# Patient Record
Sex: Female | Born: 1945
Health system: Southern US, Community
[De-identification: ages and names within clinical notes are randomized; demographics above are authoritative.]

## PROBLEM LIST (undated history)

## (undated) DIAGNOSIS — H04129 Dry eye syndrome of unspecified lacrimal gland: Secondary | ICD-10-CM

## (undated) DIAGNOSIS — G250 Essential tremor: Secondary | ICD-10-CM

## (undated) DIAGNOSIS — M1990 Unspecified osteoarthritis, unspecified site: Secondary | ICD-10-CM

## (undated) DIAGNOSIS — L309 Dermatitis, unspecified: Secondary | ICD-10-CM

## (undated) DIAGNOSIS — R112 Nausea with vomiting, unspecified: Secondary | ICD-10-CM

## (undated) DIAGNOSIS — G4700 Insomnia, unspecified: Secondary | ICD-10-CM

## (undated) DIAGNOSIS — Z9889 Other specified postprocedural states: Secondary | ICD-10-CM

## (undated) DIAGNOSIS — I498 Other specified cardiac arrhythmias: Secondary | ICD-10-CM

## (undated) DIAGNOSIS — K219 Gastro-esophageal reflux disease without esophagitis: Secondary | ICD-10-CM

## (undated) DIAGNOSIS — R42 Dizziness and giddiness: Secondary | ICD-10-CM

## (undated) DIAGNOSIS — I10 Essential (primary) hypertension: Secondary | ICD-10-CM

## (undated) DIAGNOSIS — M722 Plantar fascial fibromatosis: Secondary | ICD-10-CM

## (undated) HISTORY — PX: ABDOMINAL HYSTERECTOMY: SHX81

## (undated) HISTORY — PX: CERVICAL FUSION: SHX112

## (undated) HISTORY — DX: Gastro-esophageal reflux disease without esophagitis: K21.9

## (undated) HISTORY — PX: CHOLECYSTECTOMY: SHX55

## (undated) HISTORY — DX: Unspecified osteoarthritis, unspecified site: M19.90

## (undated) HISTORY — PX: BREAST SURGERY: SHX581

## (undated) HISTORY — PX: SHOULDER ARTHROSCOPY: SHX128

## (undated) HISTORY — PX: APPENDECTOMY: SHX54

## (undated) HISTORY — PX: KNEE ARTHROSCOPY WITH DRILLING/MICROFRACTURE: SHX6425

## (undated) HISTORY — DX: Essential tremor: G25.0

## (undated) HISTORY — PX: JOINT REPLACEMENT: SHX530

## (undated) HISTORY — PX: CATARACT EXTRACTION, BILATERAL: SHX1313

## (undated) HISTORY — PX: SPINE SURGERY: SHX786

## (undated) HISTORY — PX: BICEPS TENDON REPAIR: SHX566

---

## 1973-06-08 HISTORY — PX: BREAST BIOPSY: SHX20

## 2011-10-05 LAB — HM COLONOSCOPY

## 2014-03-16 LAB — HM MAMMOGRAPHY

## 2014-04-17 ENCOUNTER — Ambulatory Visit: Payer: Self-pay | Admitting: Family Medicine

## 2014-04-27 ENCOUNTER — Telehealth: Payer: Self-pay | Admitting: General Practice

## 2014-04-27 NOTE — Telephone Encounter (Signed)
Left a vm to r/s patient to another provider who can accept a new medicare patient. Maybe Baity ?

## 2014-05-01 ENCOUNTER — Encounter: Payer: Self-pay | Admitting: Family Medicine

## 2014-05-10 ENCOUNTER — Telehealth: Payer: Self-pay | Admitting: General Practice

## 2014-05-10 NOTE — Telephone Encounter (Signed)
Per [facility] patient cancelled appt. Called pt to verify cancellation of appt.

## 2014-05-11 ENCOUNTER — Ambulatory Visit (INDEPENDENT_AMBULATORY_CARE_PROVIDER_SITE_OTHER): Payer: Self-pay | Admitting: Family Medicine

## 2014-05-11 ENCOUNTER — Ambulatory Visit: Payer: Self-pay | Admitting: Family Medicine

## 2014-05-11 ENCOUNTER — Encounter: Payer: Self-pay | Admitting: Family Medicine

## 2014-05-11 ENCOUNTER — Encounter (INDEPENDENT_AMBULATORY_CARE_PROVIDER_SITE_OTHER): Payer: Self-pay

## 2014-05-11 VITALS — BP 126/76 | HR 63 | Temp 97.9°F | Ht 62.25 in | Wt 168.8 lb

## 2014-05-11 DIAGNOSIS — G250 Essential tremor: Secondary | ICD-10-CM

## 2014-05-11 DIAGNOSIS — M1990 Unspecified osteoarthritis, unspecified site: Secondary | ICD-10-CM

## 2014-05-11 DIAGNOSIS — R251 Tremor, unspecified: Secondary | ICD-10-CM

## 2014-05-11 DIAGNOSIS — K219 Gastro-esophageal reflux disease without esophagitis: Secondary | ICD-10-CM

## 2014-05-11 NOTE — Progress Notes (Signed)
Pre visit review using our clinic review tool, if applicable. No additional management support is needed unless otherwise documented below in the visit note.  New patient.    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  Mult family members with similar.  On BB with some relief.  She didn't tolerate higher dose of BB prev.  No ADE on current dose/med. No other new focal neuro sx.    OA, has seen ortho and note reviewed. Taking NSAID occ, with GERD hx noted, on PPI and doing well.  No acute complaints.   PMH and SH reviewed  ROS: See HPI, otherwise noncontributory.  Meds, vitals, and allergies reviewed.   GEN: nad, alert and oriented HEENT: mucous membranes moist NECK: supple w/o LA CV: rrr.  PULM: ctab, no inc wob ABD: soft, +bs EXT: no edema SKIN: no acute rash CN 2-12 wnl B, S/S wnl x4 Hand>head tremor noted.

## 2014-05-13 LAB — BASIC METABOLIC PANEL
BUN: 17 mg/dL (ref 6–23)
CHLORIDE: 105 meq/L (ref 96–112)
CO2: 29 meq/L (ref 19–32)
Calcium: 9.2 mg/dL (ref 8.4–10.5)
Creatinine, Ser: 0.8 mg/dL (ref 0.4–1.2)
GFR: 79.9 mL/min (ref >60.00)
GLUCOSE: 81 mg/dL (ref 70–99)
Potassium: 4.4 mEq/L (ref 3.5–5.1)
SODIUM: 141 meq/L (ref 135–145)

## 2014-05-14 ENCOUNTER — Encounter: Payer: Self-pay | Admitting: Family Medicine

## 2014-05-14 DIAGNOSIS — G250 Essential tremor: Secondary | ICD-10-CM | POA: Insufficient documentation

## 2014-05-14 DIAGNOSIS — M1990 Unspecified osteoarthritis, unspecified site: Secondary | ICD-10-CM | POA: Insufficient documentation

## 2014-05-14 DIAGNOSIS — K219 Gastro-esophageal reflux disease without esophagitis: Secondary | ICD-10-CM | POA: Insufficient documentation

## 2014-05-14 NOTE — Assessment & Plan Note (Signed)
Controlled, not bothersome now.  Continue BB as is and she'll update me as needed. We can refer to neuro if needed.  Overall unremarkable exam given her history.  She agrees. >30 minutes spent in face to face time with patient, >50% spent in counselling or coordination of care

## 2014-05-14 NOTE — Assessment & Plan Note (Signed)
Would continue PPI with ongoing NSAID use.  Doing well.  Healthy habits encouraged.

## 2014-05-15 ENCOUNTER — Encounter: Payer: Self-pay | Admitting: Family Medicine

## 2014-05-15 LAB — CHOLESTEROL, TOTAL
ALT: 12 U/L (ref 7–35)
AST: 17 U/L
CHOLESTEROL, TOTAL: 211
CREATININE: 0.68
Glucose: 88
HDL: 75 mg/dL — AB (ref 35–70)
LDL CALC: 121
TRIGLYCERIDES: 74

## 2014-05-17 ENCOUNTER — Encounter: Payer: Self-pay | Admitting: *Deleted

## 2014-05-22 ENCOUNTER — Encounter: Payer: Self-pay | Admitting: Family Medicine

## 2014-09-19 ENCOUNTER — Telehealth: Payer: Self-pay | Admitting: Family Medicine

## 2014-09-19 NOTE — Telephone Encounter (Signed)
Form is on your desk.

## 2014-09-19 NOTE — Telephone Encounter (Signed)
Pt dropped off medical clearance form to be filled out and faxed to [facility]. Please call when completed. Thank you

## 2014-09-20 NOTE — Telephone Encounter (Signed)
  Form was faxed by Morey Hummingbird to [facility].

## 2014-09-20 NOTE — Telephone Encounter (Signed)
Form signed, thanks.

## 2015-04-18 LAB — HM MAMMOGRAPHY: HM MAMMO: NORMAL

## 2015-04-22 ENCOUNTER — Encounter: Payer: Self-pay | Admitting: Family Medicine

## 2015-04-26 ENCOUNTER — Other Ambulatory Visit: Payer: Self-pay | Admitting: Family Medicine

## 2015-04-26 NOTE — Telephone Encounter (Signed)
Electronically refill request  Prescriptions have not been prescribed by Dr Damita Dunnings yet. Ok to refill? Last seen on 05/11/2014. CPE schedule on 06/13/2015.

## 2015-04-28 NOTE — Telephone Encounter (Signed)
Sent. Thanks.   

## 2015-05-13 ENCOUNTER — Other Ambulatory Visit: Payer: Self-pay | Admitting: Family Medicine

## 2015-05-13 DIAGNOSIS — M949 Disorder of cartilage, unspecified: Secondary | ICD-10-CM

## 2015-05-13 DIAGNOSIS — E785 Hyperlipidemia, unspecified: Secondary | ICD-10-CM

## 2015-05-13 DIAGNOSIS — M899 Disorder of bone, unspecified: Secondary | ICD-10-CM

## 2015-05-15 ENCOUNTER — Other Ambulatory Visit (INDEPENDENT_AMBULATORY_CARE_PROVIDER_SITE_OTHER): Payer: Medicare Other

## 2015-05-15 DIAGNOSIS — M949 Disorder of cartilage, unspecified: Secondary | ICD-10-CM | POA: Diagnosis not present

## 2015-05-15 DIAGNOSIS — M899 Disorder of bone, unspecified: Secondary | ICD-10-CM

## 2015-05-15 DIAGNOSIS — E785 Hyperlipidemia, unspecified: Secondary | ICD-10-CM | POA: Diagnosis not present

## 2015-05-15 LAB — COMPREHENSIVE METABOLIC PANEL
ALT: 13 U/L (ref 0–35)
AST: 18 U/L (ref 0–37)
Albumin: 3.8 g/dL (ref 3.5–5.2)
Alkaline Phosphatase: 57 U/L (ref 39–117)
BUN: 15 mg/dL (ref 6–23)
CHLORIDE: 105 meq/L (ref 96–112)
CO2: 29 mEq/L (ref 19–32)
CREATININE: 0.79 mg/dL (ref 0.40–1.20)
Calcium: 9.2 mg/dL (ref 8.4–10.5)
GFR: 76.19 mL/min (ref >60.00)
Glucose, Bld: 90 mg/dL (ref 70–99)
Potassium: 4.2 mEq/L (ref 3.5–5.1)
SODIUM: 142 meq/L (ref 135–145)
TOTAL PROTEIN: 6.4 g/dL (ref 6.0–8.3)
Total Bilirubin: 0.4 mg/dL (ref 0.2–1.2)

## 2015-05-15 LAB — LIPID PANEL
CHOL/HDL RATIO: 4
CHOLESTEROL: 172 mg/dL (ref 0–200)
HDL: 48.4 mg/dL (ref >39.00)
LDL CALC: 102 mg/dL — AB (ref 0–99)
NonHDL: 123.4
Triglycerides: 105 mg/dL (ref 0.0–149.0)
VLDL: 21 mg/dL (ref 0.0–40.0)

## 2015-05-15 LAB — VITAMIN D 25 HYDROXY (VIT D DEFICIENCY, FRACTURES): VITD: 41.97 ng/mL (ref 30.00–100.00)

## 2015-05-22 ENCOUNTER — Encounter: Payer: Self-pay | Admitting: Family Medicine

## 2015-05-22 ENCOUNTER — Ambulatory Visit (INDEPENDENT_AMBULATORY_CARE_PROVIDER_SITE_OTHER): Payer: Medicare Other | Admitting: Family Medicine

## 2015-05-22 VITALS — BP 104/68 | HR 60 | Temp 98.5°F | Ht 62.0 in | Wt 180.5 lb

## 2015-05-22 DIAGNOSIS — E2839 Other primary ovarian failure: Secondary | ICD-10-CM

## 2015-05-22 DIAGNOSIS — Z23 Encounter for immunization: Secondary | ICD-10-CM

## 2015-05-22 DIAGNOSIS — Z0000 Encounter for general adult medical examination without abnormal findings: Secondary | ICD-10-CM

## 2015-05-22 DIAGNOSIS — Z119 Encounter for screening for infectious and parasitic diseases, unspecified: Secondary | ICD-10-CM

## 2015-05-22 DIAGNOSIS — M1990 Unspecified osteoarthritis, unspecified site: Secondary | ICD-10-CM | POA: Diagnosis not present

## 2015-05-22 DIAGNOSIS — G250 Essential tremor: Secondary | ICD-10-CM | POA: Diagnosis not present

## 2015-05-22 DIAGNOSIS — Z7189 Other specified counseling: Secondary | ICD-10-CM

## 2015-05-22 DIAGNOSIS — K219 Gastro-esophageal reflux disease without esophagitis: Secondary | ICD-10-CM

## 2015-05-22 MED ORDER — PROPRANOLOL HCL 60 MG PO TABS: 60.0000 mg | ORAL_TABLET | Freq: Two times a day (BID) | ORAL | Status: DC

## 2015-05-22 MED ORDER — OMEPRAZOLE 20 MG PO CPDR: DELAYED_RELEASE_CAPSULE | ORAL | Status: DC

## 2015-05-22 MED ORDER — MELOXICAM 15 MG PO TABS: 15.0000 mg | ORAL_TABLET | Freq: Every day | ORAL | Status: DC | PRN

## 2015-05-22 NOTE — Patient Instructions (Signed)
Katie Hale will call about your referral. Take care.  Glad to see you.  Don't change your meds.  Update me as needed.

## 2015-05-22 NOTE — Progress Notes (Signed)
Pre visit review using our clinic review tool, if applicable. No additional management support is needed unless otherwise documented below in the visit note.  I have personally reviewed the Medicare Annual Wellness questionnaire and have noted 1. The patient's medical and social history 2. Their use of alcohol, tobacco or illicit drugs 3. Their current medications and supplements 4. The patient's functional ability including ADL's, fall risks, home safety risks and hearing or visual             impairment. 5. Diet and physical activities 6. Evidence for depression or mood disorders  The patients weight, height, BMI have been recorded in the chart and visual acuity is per eye clinic.  I have made referrals, counseling and provided education to the patient based review of the above and I have provided the pt with a written personalized care plan for preventive services.  Provider list updated- see scanned forms.  Routine anticipatory guidance given to patient.  See health maintenance.  Flu 2016 Shingles prev done. PNA 2016 Tetanus ~9 years ago per patient report.  Colonoscopy 2013 Breast cancer screening 2016 DXA pending.  Advance directive- husband designated if patient were incapacitated.  Cognitive function addressed- see scanned forms- and if abnormal then additional documentation follows.  Pt opts in for HCV screening with next set of labs.  D/w pt re: routine screening.    Tremor.  Noted FH.  Not worse overall recently.  Some improvement with BB, worse with caffeine.  Not bothersome enough to change mgmt per patient report.  D/w pt about referral if needed.  She wants to hold off for now.  No ADE on current med.   GERD controlled with PPI, no ADE.  Doing well.  Occ nsaid use for OA but not ADE with med.   PMH and SH reviewed  Meds, vitals, and allergies reviewed.   ROS: See HPI.  Otherwise negative.    GEN: nad, alert and oriented HEENT: mucous membranes moist NECK: supple  w/o LA CV: rrr. PULM: ctab, no inc wob ABD: soft, +bs EXT: no edema SKIN: no acute rash B hand and jaw tremor noted at baseline.

## 2015-05-23 DIAGNOSIS — Z7189 Other specified counseling: Secondary | ICD-10-CM | POA: Insufficient documentation

## 2015-05-23 DIAGNOSIS — Z0000 Encounter for general adult medical examination without abnormal findings: Secondary | ICD-10-CM | POA: Insufficient documentation

## 2015-05-23 NOTE — Assessment & Plan Note (Signed)
Continue prn nsaid with food.

## 2015-05-23 NOTE — Assessment & Plan Note (Signed)
Flu 2016 Shingles prev done. PNA 2016 Tetanus ~9 years ago per patient report.  Colonoscopy 2013 Breast cancer screening 2016 DXA pending.  Advance directive- husband designated if patient were incapacitated.   Cognitive function addressed- see scanned forms- and if abnormal then additional documentation follows.  Pt opts in for HCV screening with next set of labs.  D/w pt re: routine screening.

## 2015-05-23 NOTE — Assessment & Plan Note (Signed)
Continue PPI ?

## 2015-05-23 NOTE — Assessment & Plan Note (Signed)
Noted FH. Not worse overall recently. Some improvement with BB, worse with caffeine. Not bothersome enough to change mgmt per patient report. D/w pt about referral if needed. She wants to hold off for now. No ADE on current med.

## 2015-06-07 ENCOUNTER — Other Ambulatory Visit: Payer: Self-pay

## 2015-06-13 ENCOUNTER — Ambulatory Visit
Admission: RE | Admit: 2015-06-13 | Discharge: 2015-06-13 | Disposition: A | Discharge status: Home / Self Care | Payer: Medicare Other | Source: Ambulatory Visit | Attending: Family Medicine | Admitting: Family Medicine

## 2015-06-13 ENCOUNTER — Encounter: Payer: Self-pay | Admitting: Family Medicine

## 2015-06-13 DIAGNOSIS — Z780 Asymptomatic menopausal state: Secondary | ICD-10-CM | POA: Diagnosis not present

## 2015-06-13 DIAGNOSIS — E2839 Other primary ovarian failure: Secondary | ICD-10-CM | POA: Diagnosis present

## 2015-07-27 ENCOUNTER — Other Ambulatory Visit: Payer: Self-pay | Admitting: Family Medicine

## 2015-11-05 ENCOUNTER — Telehealth: Payer: Self-pay

## 2015-11-05 NOTE — Telephone Encounter (Signed)
Patient is on the list for [employer] 2017 and may be a good candidate for an AWV in 2017.  

## 2015-11-12 NOTE — Telephone Encounter (Signed)
Pt is not due for an AWV until December 2017.

## 2015-11-29 NOTE — Telephone Encounter (Signed)
pls let me know when/if it is scheduled.  

## 2016-04-21 ENCOUNTER — Other Ambulatory Visit: Payer: Self-pay | Admitting: Family Medicine

## 2016-04-21 DIAGNOSIS — Z1231 Encounter for screening mammogram for malignant neoplasm of breast: Secondary | ICD-10-CM

## 2016-04-24 ENCOUNTER — Ambulatory Visit: Admission: RE | Admit: 2016-04-24 | Payer: Medicare Other | Source: Ambulatory Visit

## 2016-05-05 ENCOUNTER — Ambulatory Visit
Admission: RE | Admit: 2016-05-05 | Discharge: 2016-05-05 | Disposition: A | Discharge status: Home / Self Care | Payer: Medicare Other | Source: Ambulatory Visit | Attending: Family Medicine | Admitting: Family Medicine

## 2016-05-05 DIAGNOSIS — Z1231 Encounter for screening mammogram for malignant neoplasm of breast: Secondary | ICD-10-CM | POA: Diagnosis present

## 2016-05-10 ENCOUNTER — Other Ambulatory Visit: Payer: Self-pay | Admitting: Family Medicine

## 2016-05-10 DIAGNOSIS — E785 Hyperlipidemia, unspecified: Secondary | ICD-10-CM

## 2016-05-13 ENCOUNTER — Other Ambulatory Visit: Payer: Self-pay | Admitting: *Deleted

## 2016-05-13 ENCOUNTER — Inpatient Hospital Stay
Admission: RE | Admit: 2016-05-13 | Discharge: 2016-05-13 | Disposition: A | Discharge status: Home / Self Care | Payer: Self-pay | Source: Ambulatory Visit | Attending: *Deleted | Admitting: *Deleted

## 2016-05-13 DIAGNOSIS — Z9289 Personal history of other medical treatment: Secondary | ICD-10-CM

## 2016-05-15 ENCOUNTER — Encounter: Payer: Self-pay | Admitting: *Deleted

## 2016-05-15 ENCOUNTER — Other Ambulatory Visit (INDEPENDENT_AMBULATORY_CARE_PROVIDER_SITE_OTHER): Payer: Medicare Other

## 2016-05-15 DIAGNOSIS — Z119 Encounter for screening for infectious and parasitic diseases, unspecified: Secondary | ICD-10-CM

## 2016-05-15 DIAGNOSIS — E785 Hyperlipidemia, unspecified: Secondary | ICD-10-CM | POA: Diagnosis not present

## 2016-05-15 LAB — COMPREHENSIVE METABOLIC PANEL
ALBUMIN: 4.2 g/dL (ref 3.5–5.2)
ALK PHOS: 65 U/L (ref 39–117)
ALT: 13 U/L (ref 0–35)
AST: 16 U/L (ref 0–37)
BILIRUBIN TOTAL: 0.4 mg/dL (ref 0.2–1.2)
BUN: 17 mg/dL (ref 6–23)
CO2: 31 mEq/L (ref 19–32)
CREATININE: 0.8 mg/dL (ref 0.40–1.20)
Calcium: 9.5 mg/dL (ref 8.4–10.5)
Chloride: 105 mEq/L (ref 96–112)
GFR: 74.88 mL/min (ref >60.00)
GLUCOSE: 97 mg/dL (ref 70–99)
Potassium: 4 mEq/L (ref 3.5–5.1)
SODIUM: 142 meq/L (ref 135–145)
TOTAL PROTEIN: 7.4 g/dL (ref 6.0–8.3)

## 2016-05-15 LAB — LIPID PANEL
CHOLESTEROL: 205 mg/dL — AB (ref 0–200)
HDL: 68.5 mg/dL (ref >39.00)
LDL Cholesterol: 120 mg/dL — ABNORMAL HIGH (ref 0–99)
NONHDL: 136.7
Total CHOL/HDL Ratio: 3
Triglycerides: 85 mg/dL (ref 0.0–149.0)
VLDL: 17 mg/dL (ref 0.0–40.0)

## 2016-05-16 LAB — HEPATITIS C ANTIBODY: HCV AB: NEGATIVE

## 2016-05-22 ENCOUNTER — Ambulatory Visit (INDEPENDENT_AMBULATORY_CARE_PROVIDER_SITE_OTHER): Payer: Medicare Other | Admitting: Family Medicine

## 2016-05-22 ENCOUNTER — Encounter: Payer: Self-pay | Admitting: Family Medicine

## 2016-05-22 VITALS — BP 132/80 | HR 53 | Temp 98.6°F | Ht 62.0 in | Wt 180.0 lb

## 2016-05-22 DIAGNOSIS — M1990 Unspecified osteoarthritis, unspecified site: Secondary | ICD-10-CM | POA: Diagnosis not present

## 2016-05-22 DIAGNOSIS — G250 Essential tremor: Secondary | ICD-10-CM

## 2016-05-22 DIAGNOSIS — Z0000 Encounter for general adult medical examination without abnormal findings: Secondary | ICD-10-CM

## 2016-05-22 DIAGNOSIS — Z7189 Other specified counseling: Secondary | ICD-10-CM

## 2016-05-22 MED ORDER — MELOXICAM 15 MG PO TABS: 15.0000 mg | ORAL_TABLET | Freq: Every day | ORAL | 3 refills | Status: DC | PRN

## 2016-05-22 MED ORDER — ONDANSETRON HCL 4 MG PO TABS: 4.0000 mg | ORAL_TABLET | Freq: Three times a day (TID) | ORAL | 2 refills | Status: DC | PRN

## 2016-05-22 MED ORDER — PROPRANOLOL HCL 60 MG PO TABS: 60.0000 mg | ORAL_TABLET | Freq: Two times a day (BID) | ORAL | 3 refills | Status: DC

## 2016-05-22 MED ORDER — OMEPRAZOLE 20 MG PO CPDR: DELAYED_RELEASE_CAPSULE | ORAL | 3 refills | Status: DC

## 2016-05-22 NOTE — Progress Notes (Signed)
I have personally reviewed the Medicare Annual Wellness questionnaire and have noted 1. The patient's medical and social history 2. Their use of alcohol, tobacco or illicit drugs 3. Their current medications and supplements 4. The patient's functional ability including ADL's, fall risks, home safety risks and hearing or visual             impairment. 5. Diet and physical activities 6. Evidence for depression or mood disorders  The patients weight, height, BMI have been recorded in the chart and visual acuity is per eye clinic.  I have made referrals, counseling and provided education to the patient based review of the above and I have provided the pt with a written personalized care plan for preventive services.  Provider list updated- see scanned forms.  Routine anticipatory guidance given to patient.  See health maintenance.  Flu 2017 Shingles prev done. PNA 2017 Tetanus 2008 Colonoscopy 2013 Breast cancer screening 2017 DXA 2017. Advance directive- daughter designated if patient were incapacitated.   Cognitive function addressed- see scanned forms- and if abnormal then additional documentation follows.  Labs d/w pt.   Tremor.  No ADE on med.  Manageable.  Complaint with beta blocker.   H/o OA.  She has taken meloxicam prn in the meantime.  No ADE on med.  Hips prev injected.    Her husband is on hospice and she is working through all of the changes.   This is a noted stressor for her.  She is trying to help him have as good a day as possible.  She has nausea that is likely stress related.  No vomiting, no passed blood.    PMH and SH reviewed  Meds, vitals, and allergies reviewed.   ROS: Per HPI.  Unless specifically indicated otherwise in HPI, the patient denies:  General: fever. Eyes: acute vision changes ENT: sore throat Cardiovascular: chest pain Respiratory: SOB GI: vomiting GU: dysuria Musculoskeletal: acute back pain Derm: acute rash Neuro: acute motor  dysfunction Psych: worsening mood Endocrine: polydipsia Heme: bleeding Allergy: hayfever  GEN: nad, alert and oriented HEENT: mucous membranes moist NECK: supple w/o LA CV: rrr. PULM: ctab, no inc wob ABD: soft, +bs EXT: no edema SKIN: no acute rash Tremor noted at baseline

## 2016-05-22 NOTE — Patient Instructions (Addendum)
Let me know when you need/want to get a colonoscopy set up or if you want to see neurology about the tremor.  Take care.  Glad to see you.  Update me as needed.

## 2016-05-22 NOTE — Progress Notes (Signed)
Pre visit review using our clinic review tool, if applicable. No additional management support is needed unless otherwise documented below in the visit note. 

## 2016-05-24 NOTE — Assessment & Plan Note (Signed)
If worsening she will let me know and we can refer to neurology. Continue as is for now. She agrees.

## 2016-05-24 NOTE — Assessment & Plan Note (Addendum)
Flu 2017 Shingles prev done. PNA 2017 Tetanus 2008 Colonoscopy 2013- see after visit summary. She will update me when she wants to go for another colonoscopy. Breast cancer screening 2017 DXA 2017. Advance directive- daughter designated if patient were incapacitated.   Cognitive function addressed- see scanned forms- and if abnormal then additional documentation follows.  Labs d/w pt.

## 2016-05-24 NOTE — Assessment & Plan Note (Signed)
Advance directive- daughter designated if patient were incapacitated.   

## 2016-05-24 NOTE — Assessment & Plan Note (Signed)
No adverse effect on medication. Continue as is. Update me as needed. She agrees.

## 2016-07-16 ENCOUNTER — Encounter: Payer: Self-pay | Admitting: Family Medicine

## 2016-07-17 ENCOUNTER — Other Ambulatory Visit: Payer: Self-pay | Admitting: Family Medicine

## 2016-07-17 MED ORDER — PREDNISONE 10 MG PO TABS: ORAL_TABLET | ORAL | 0 refills | Status: DC

## 2017-05-11 ENCOUNTER — Other Ambulatory Visit: Payer: Self-pay | Admitting: Family Medicine

## 2017-05-11 DIAGNOSIS — E785 Hyperlipidemia, unspecified: Secondary | ICD-10-CM

## 2017-05-12 ENCOUNTER — Other Ambulatory Visit (INDEPENDENT_AMBULATORY_CARE_PROVIDER_SITE_OTHER): Payer: Medicare Other

## 2017-05-12 ENCOUNTER — Ambulatory Visit: Payer: Medicare Other

## 2017-05-12 ENCOUNTER — Other Ambulatory Visit: Payer: Self-pay | Admitting: Family Medicine

## 2017-05-12 DIAGNOSIS — E785 Hyperlipidemia, unspecified: Secondary | ICD-10-CM | POA: Diagnosis not present

## 2017-05-12 DIAGNOSIS — Z1231 Encounter for screening mammogram for malignant neoplasm of breast: Secondary | ICD-10-CM

## 2017-05-12 LAB — COMPREHENSIVE METABOLIC PANEL
ALBUMIN: 4.2 g/dL (ref 3.5–5.2)
ALT: 16 U/L (ref 0–35)
AST: 19 U/L (ref 0–37)
Alkaline Phosphatase: 62 U/L (ref 39–117)
BILIRUBIN TOTAL: 0.7 mg/dL (ref 0.2–1.2)
BUN: 14 mg/dL (ref 6–23)
CALCIUM: 9 mg/dL (ref 8.4–10.5)
CHLORIDE: 103 meq/L (ref 96–112)
CO2: 29 mEq/L (ref 19–32)
CREATININE: 0.73 mg/dL (ref 0.40–1.20)
GFR: 83 mL/min (ref >60.00)
Glucose, Bld: 84 mg/dL (ref 70–99)
Potassium: 3.8 mEq/L (ref 3.5–5.1)
Sodium: 140 mEq/L (ref 135–145)
Total Protein: 7.1 g/dL (ref 6.0–8.3)

## 2017-05-12 LAB — LIPID PANEL
CHOLESTEROL: 178 mg/dL (ref 0–200)
HDL: 64 mg/dL (ref >39.00)
LDL Cholesterol: 93 mg/dL (ref 0–99)
NONHDL: 113.86
Total CHOL/HDL Ratio: 3
Triglycerides: 105 mg/dL (ref 0.0–149.0)
VLDL: 21 mg/dL (ref 0.0–40.0)

## 2017-05-17 ENCOUNTER — Encounter: Payer: Medicare Other | Admitting: Family Medicine

## 2017-05-25 ENCOUNTER — Other Ambulatory Visit: Payer: Self-pay | Admitting: Family Medicine

## 2017-05-25 ENCOUNTER — Ambulatory Visit: Payer: Medicare Other

## 2017-05-25 NOTE — Telephone Encounter (Signed)
Electronic refill request. Meloxicam Last office visit:   05/22/16  Patient has CPE scheduled 06/07/17 Last Filled:    30 tablet 3 05/22/2016  Please advise.

## 2017-05-26 NOTE — Telephone Encounter (Signed)
Sent. Thanks.   

## 2017-06-07 ENCOUNTER — Encounter: Payer: Self-pay | Admitting: Family Medicine

## 2017-06-07 ENCOUNTER — Ambulatory Visit (INDEPENDENT_AMBULATORY_CARE_PROVIDER_SITE_OTHER): Payer: Medicare Other | Admitting: Family Medicine

## 2017-06-07 VITALS — BP 132/68 | HR 58 | Temp 97.7°F | Wt 181.5 lb

## 2017-06-07 DIAGNOSIS — Z7189 Other specified counseling: Secondary | ICD-10-CM

## 2017-06-07 DIAGNOSIS — Z0000 Encounter for general adult medical examination without abnormal findings: Secondary | ICD-10-CM | POA: Diagnosis not present

## 2017-06-07 DIAGNOSIS — G250 Essential tremor: Secondary | ICD-10-CM | POA: Diagnosis not present

## 2017-06-07 MED ORDER — OMEPRAZOLE 20 MG PO CPDR: DELAYED_RELEASE_CAPSULE | ORAL | 3 refills | Status: DC

## 2017-06-07 MED ORDER — PROPRANOLOL HCL 60 MG PO TABS: 60.0000 mg | ORAL_TABLET | Freq: Two times a day (BID) | ORAL | 3 refills | Status: DC

## 2017-06-07 MED ORDER — ONDANSETRON HCL 4 MG PO TABS: 4.0000 mg | ORAL_TABLET | Freq: Three times a day (TID) | ORAL | 2 refills | Status: DC | PRN

## 2017-06-07 NOTE — Patient Instructions (Signed)
Thanks for your effort.  Take care.  Glad to see you.  Update me as needed.  Colonoscopy when possible- let me know.  Thanks for talking to me to day.

## 2017-06-07 NOTE — Progress Notes (Signed)
I have personally reviewed the Medicare Annual Wellness questionnaire and have noted 1. The patient's medical and social history 2. Their use of alcohol, tobacco or illicit drugs 3. Their current medications and supplements 4. The patient's functional ability including ADL's, fall risks, home safety risks and hearing or visual             impairment. 5. Diet and physical activities 6. Evidence for depression or mood disorders  The patients weight, height, BMI have been recorded in the chart and visual acuity is per eye clinic.  I have made referrals, counseling and provided education to the patient based review of the above and I have provided the pt with a written personalized care plan for preventive services.  Provider list updated- see scanned forms.  Routine anticipatory guidance given to patient.  See health maintenance. The possibility exists that previously documented standard health maintenance information may have been brought forward from a previous encounter into this note.  If needed, that same information has been updated to reflect the current situation based on today's encounter.    Flu 2018 Shingles prev done. PNA 2017 Tetanus 2008 Colonoscopy 2013- see after visit summary. She will update me when she wants to go for another colonoscopy. Defer for now given the situation with her husband, d/w pt.   Breast cancer screening 2017 DXA 2017. Advance directive- daughter designated if patient were incapacitated.  Cognitive function addressed- see scanned forms- and if abnormal then additional documentation follows.  Labs d/w pt.   Tremor d/w pt.  Still on BB.  She is tolerating her current situation.  D/w pt about possible neuro referral, but defer for now.   She agrees.  FH tremor noted.   Still on prn meloxicam.  She has R hip bursitis pain, prev injected by ortho.  That is the most painful spot for patient.  She is still icing.  She is tolerating the situation as is.   She  is caring for her husband who is dying on hospice with cancer.    PMH and SH reviewed  Meds, vitals, and allergies reviewed.   ROS: Per HPI.  Unless specifically indicated otherwise in HPI, the patient denies:  General: fever. Eyes: acute vision changes ENT: sore throat Cardiovascular: chest pain Respiratory: SOB GI: vomiting GU: dysuria Musculoskeletal: acute back pain Derm: acute rash Neuro: acute motor dysfunction Psych: worsening mood Endocrine: polydipsia Heme: bleeding Allergy: hayfever  GEN: nad, alert and oriented HEENT: mucous membranes moist NECK: supple w/o LA CV: rrr. PULM: ctab, no inc wob ABD: soft, +bs EXT: no edema SKIN: no acute rash Tremor noted in hands and jaw at baseline.

## 2017-06-09 NOTE — Assessment & Plan Note (Signed)
Flu 2018 Shingles prev done. PNA 2017 Tetanus 2008 Colonoscopy 2013- see after visit summary. She will update me when she wants to go for another colonoscopy. Defer for now given the situation with her husband, d/w pt.   Breast cancer screening 2017 DXA 2017. Advance directive- daughter designated if patient were incapacitated.  Cognitive function addressed- see scanned forms- and if abnormal then additional documentation follows.  Labs d/w pt.

## 2017-06-09 NOTE — Assessment & Plan Note (Addendum)
We can refer to neurology later on.  Patient will update me as needed.  Continue beta-blocker as is for now.  She agrees. In addition to the time I spent performing AWV, >15 minutes spent in face to face time with patient, >50% spent in counselling or coordination of care discussing options re: tremor, referral, path/phys, etc.

## 2017-06-09 NOTE — Assessment & Plan Note (Signed)
Advance directive- daughter designated if patient were incapacitated.   

## 2017-06-15 ENCOUNTER — Ambulatory Visit
Admission: RE | Admit: 2017-06-15 | Discharge: 2017-06-15 | Disposition: A | Discharge status: Home / Self Care | Payer: Medicare Other | Source: Ambulatory Visit | Attending: Family Medicine | Admitting: Family Medicine

## 2017-06-15 DIAGNOSIS — Z1231 Encounter for screening mammogram for malignant neoplasm of breast: Secondary | ICD-10-CM | POA: Insufficient documentation

## 2017-07-11 ENCOUNTER — Encounter: Payer: Self-pay | Admitting: Family Medicine

## 2017-07-16 ENCOUNTER — Other Ambulatory Visit: Payer: Self-pay | Admitting: Family Medicine

## 2017-10-19 ENCOUNTER — Telehealth: Payer: Medicare Other | Admitting: Family

## 2017-10-19 DIAGNOSIS — T148XXA Other injury of unspecified body region, initial encounter: Secondary | ICD-10-CM

## 2017-10-19 MED ORDER — PREDNISONE 10 MG (21) PO TBPK: ORAL_TABLET | ORAL | 0 refills | Status: DC

## 2017-10-19 MED ORDER — CYCLOBENZAPRINE HCL 5 MG PO TABS: 10.0000 mg | ORAL_TABLET | Freq: Three times a day (TID) | ORAL | 0 refills | Status: DC | PRN

## 2017-10-19 NOTE — Progress Notes (Signed)
We are sorry that you are not feeling well.  Here is how we plan to help!  Based on what you have shared with me it looks like you mostly have acute back pain.  Acute back pain is defined as musculoskeletal pain that can resolve in 1-3 weeks with conservative treatment.  I have prescribed Prednisone as well as Flexeril 5 mg three times a day as needed.   Some patients experience stomach irritation or in increased heartburn with anti-inflammatory drugs.  Please keep in mind that muscle relaxer's can cause fatigue and should not be taken while at work or driving.  Back pain is very common.  The pain often gets better over time.  The cause of back pain is usually not dangerous.  Most people can learn to manage their back pain on their own.  Home Care  Stay active.  Start with short walks on flat ground if you can.  Try to walk farther each day.  Do not sit, drive or stand in one place for more than 30 minutes.  Do not stay in bed.  Do not avoid exercise or work.  Activity can help your back heal faster.  Be careful when you bend or lift an object.  Bend at your knees, keep the object close to you, and do not twist.  Sleep on a firm mattress.  Lie on your side, and bend your knees.  If you lie on your back, put a pillow under your knees.  Only take medicines as told by your doctor.  Put ice on the injured area.  Put ice in a plastic bag  Place a towel between your skin and the bag  Leave the ice on for 15-20 minutes, 3-4 times a day for the first 2-3 days. 210 After that, you can switch between ice and heat packs.  Ask your doctor about back exercises or massage.  Avoid feeling anxious or stressed.  Find good ways to deal with stress, such as exercise.  Get Help Right Way If:  Your pain does not go away with rest or medicine.  Your pain does not go away in 1 week.  You have new problems.  You do not feel well.  The pain spreads into your legs.  You cannot control when you  poop (bowel movement) or pee (urinate)  You feel sick to your stomach (nauseous) or throw up (vomit)  You have belly (abdominal) pain.  You feel like you may pass out (faint).  If you develop a fever.  Make Sure you:  Understand these instructions.  Will watch your condition  Will get help right away if you are not doing well or get worse.  Your e-visit answers were reviewed by a board certified advanced clinical practitioner to complete your personal care plan.  Depending on the condition, your plan could have included both over the counter or prescription medications.  If there is a problem please reply  once you have received a response from your provider.  Your safety is important to [place].  If you have drug allergies check your prescription carefully.    You can use MyChart to ask questions about today's visit, request a non-urgent call back, or ask for a work or school excuse for 24 hours related to this e-Visit. If it has been greater than 24 hours you will need to follow up with your provider, or enter a new e-Visit to address those concerns.  You will get an e-mail in the next  to address those concerns.  You will get an e-mail in the next two days asking about your experience.  I hope that your e-visit has been valuable and will speed your recovery. Thank you for using e-visits.   

## 2018-01-06 HISTORY — PX: HAND TUMOR EXCISION: SUR568

## 2018-04-25 ENCOUNTER — Other Ambulatory Visit: Payer: Self-pay | Admitting: Family Medicine

## 2018-04-25 DIAGNOSIS — Z1231 Encounter for screening mammogram for malignant neoplasm of breast: Secondary | ICD-10-CM

## 2018-06-16 ENCOUNTER — Other Ambulatory Visit: Payer: Self-pay | Admitting: Family Medicine

## 2018-06-16 ENCOUNTER — Ambulatory Visit
Admission: RE | Admit: 2018-06-16 | Discharge: 2018-06-16 | Disposition: A | Discharge status: Home / Self Care | Payer: Medicare Other | Source: Ambulatory Visit | Attending: Family Medicine | Admitting: Family Medicine

## 2018-06-16 DIAGNOSIS — Z1231 Encounter for screening mammogram for malignant neoplasm of breast: Secondary | ICD-10-CM | POA: Diagnosis not present

## 2018-06-16 DIAGNOSIS — E785 Hyperlipidemia, unspecified: Secondary | ICD-10-CM

## 2018-06-17 ENCOUNTER — Ambulatory Visit (INDEPENDENT_AMBULATORY_CARE_PROVIDER_SITE_OTHER): Payer: Medicare Other

## 2018-06-17 VITALS — BP 130/70 | HR 50 | Temp 98.1°F | Ht 62.0 in | Wt 188.2 lb

## 2018-06-17 DIAGNOSIS — Z0000 Encounter for general adult medical examination without abnormal findings: Secondary | ICD-10-CM

## 2018-06-17 DIAGNOSIS — E785 Hyperlipidemia, unspecified: Secondary | ICD-10-CM

## 2018-06-17 LAB — LIPID PANEL
Cholesterol: 178 mg/dL (ref 0–200)
HDL: 56 mg/dL (ref >39.00)
LDL Cholesterol: 99 mg/dL (ref 0–99)
NONHDL: 122.32
TRIGLYCERIDES: 115 mg/dL (ref 0.0–149.0)
Total CHOL/HDL Ratio: 3
VLDL: 23 mg/dL (ref 0.0–40.0)

## 2018-06-17 LAB — COMPREHENSIVE METABOLIC PANEL
ALBUMIN: 4.1 g/dL (ref 3.5–5.2)
ALK PHOS: 69 U/L (ref 39–117)
ALT: 14 U/L (ref 0–35)
AST: 16 U/L (ref 0–37)
BUN: 13 mg/dL (ref 6–23)
CALCIUM: 9.2 mg/dL (ref 8.4–10.5)
CO2: 28 mEq/L (ref 19–32)
Chloride: 104 mEq/L (ref 96–112)
Creatinine, Ser: 0.78 mg/dL (ref 0.40–1.20)
GFR: 76.66 mL/min (ref >60.00)
GLUCOSE: 89 mg/dL (ref 70–99)
POTASSIUM: 3.7 meq/L (ref 3.5–5.1)
Sodium: 141 mEq/L (ref 135–145)
TOTAL PROTEIN: 7 g/dL (ref 6.0–8.3)
Total Bilirubin: 0.5 mg/dL (ref 0.2–1.2)

## 2018-06-17 NOTE — Patient Instructions (Signed)
Ms. Frankum , Thank you for taking time to come for your Medicare Wellness Visit. I appreciate your ongoing commitment to your health goals. Please review the following plan we discussed and let me know if I can assist you in the future.   These are the goals we discussed: Goals    . Increase physical activity     Starting 06/17/2018, I will continue to walk 30-45 minutes 4 days per week.        This is a list of the screening recommended for you and due dates:  Health Maintenance  Topic Date Due  . Colon Cancer Screening  06/06/2020*  . Tetanus Vaccine  06/06/2020*  . Mammogram  06/16/2020  . Flu Shot  Completed  . DEXA scan (bone density measurement)  Completed  .  Hepatitis C: One time screening is recommended by Center for Disease Control  (CDC) for  adults born from [birth year] through 1967.   Completed  . Pneumonia vaccines  Completed  *Topic was postponed. The date shown is not the original due date.   Preventive Care for Adults  A healthy lifestyle and preventive care can promote health and wellness. Preventive health guidelines for adults include the following key practices.  . A routine yearly physical is a good way to check with your health care provider about your health and preventive screening. It is a chance to share any concerns and updates on your health and to receive a thorough exam.  . Visit your dentist for a routine exam and preventive care every 6 months. Brush your teeth twice a day and floss once a day. Good oral hygiene prevents tooth decay and gum disease.  . The frequency of eye exams is based on your age, health, family medical history, use  of contact lenses, and other factors. Follow your health care provider's recommendations for frequency of eye exams.  . Eat a healthy diet. Foods like vegetables, fruits, whole grains, low-fat dairy products, and lean protein foods contain the nutrients you need without too many calories. Decrease your intake of foods high in  solid fats, added sugars, and salt. Eat the right amount of calories for you. Get information about a proper diet from your health care provider, if necessary.  . Regular physical exercise is one of the most important things you can do for your health. Most adults should get at least 150 minutes of moderate-intensity exercise (any activity that increases your heart rate and causes you to sweat) each week. In addition, most adults need muscle-strengthening exercises on 2 or more days a week.  Silver Sneakers may be a benefit available to you. To determine eligibility, you may visit the website: www.silversneakers.com or contact program at 718-029-9843 Mon-Fri between 8AM-8PM.   . Maintain a healthy weight. The body mass index (BMI) is a screening tool to identify possible weight problems. It provides an estimate of body fat based on height and weight. Your health care provider can find your BMI and can help you achieve or maintain a healthy weight.   For adults 20 years and older: ? A BMI below 18.5 is considered underweight. ? A BMI of 18.5 to 24.9 is normal. ? A BMI of 25 to 29.9 is considered overweight. ? A BMI of 30 and above is considered obese.   . Maintain normal blood lipids and cholesterol levels by exercising and minimizing your intake of saturated fat. Eat a balanced diet with plenty of fruit and vegetables. Blood tests for lipids and  cholesterol should begin at age 60 and be repeated every 5 years. If your lipid or cholesterol levels are high, you are over 50, or you are at high risk for heart disease, you may need your cholesterol levels checked more frequently. Ongoing high lipid and cholesterol levels should be treated with medicines if diet and exercise are not working.  . If you smoke, find out from your health care provider how to quit. If you do not use tobacco, please do not start.  . If you choose to drink alcohol, please do not consume more than 2 drinks per day. One drink  is considered to be 12 ounces (355 mL) of beer, 5 ounces (148 mL) of wine, or 1.5 ounces (44 mL) of liquor.  . If you are 40-45 years old, ask your health care provider if you should take aspirin to prevent strokes.  . Use sunscreen. Apply sunscreen liberally and repeatedly throughout the day. You should seek shade when your shadow is shorter than you. Protect yourself by wearing long sleeves, pants, a wide-brimmed hat, and sunglasses year round, whenever you are outdoors.  . Once a month, do a whole body skin exam, using a mirror to look at the skin on your back. Tell your health care provider of new moles, moles that have irregular borders, moles that are larger than a pencil eraser, or moles that have changed in shape or color.

## 2018-06-17 NOTE — Progress Notes (Signed)
PCP notes:   Health maintenance:  Tetanus vaccine - postponed Colonoscopy - postponed  Abnormal screenings:   Depression score: 4 Depression screen [facility] 2/9 06/17/2018 06/07/2017  Decreased Interest 1 0  Down, Depressed, Hopeless 1 0  PHQ - 2 Score 2 0  Altered sleeping 0 -  Tired, decreased energy 1 -  Change in appetite 1 -  Feeling bad or failure about yourself  0 -  Trouble concentrating 0 -  Moving slowly or fidgety/restless 0 -  Suicidal thoughts 0 -  PHQ-9 Score 4 -  Difficult doing work/chores Not difficult at all -   Patient concerns:   Patient is still grieving loss of spouse.   Nurse concerns:  None  Next PCP appt:   06/21/18 @ 0945  I reviewed health advisor's note, was available for consultation on the day of service listed in this note, and agree with documentation and plan. Elsie Stain, MD.

## 2018-06-17 NOTE — Progress Notes (Signed)
Subjective:   Katie Hale is a 73 y.o. female who presents for Medicare Annual (Subsequent) preventive examination.  Review of Systems:  N/A Cardiac Risk Factors include: advanced age (>29men, >87 women);dyslipidemia;obesity (BMI >30kg/m2)     Objective:     Vitals: BP 130/70 (BP Location: Right Arm, Patient Position: Sitting, Cuff Size: Large)   Pulse (!) 50   Temp 98.1 F (36.7 C) (Oral)   Ht 5\' 2"  (1.575 m) Comment: no shoes  Wt 188 lb 4 oz (85.4 kg)   SpO2 97%   BMI 34.43 kg/m   Body mass index is 34.43 kg/m.  Advanced Directives 06/17/2018  Does Patient Have a Medical Advance Directive? Yes  Type of Paramedic of [place];Living will  Copy of [place] in Chart? No - copy requested    Tobacco Social History   Tobacco Use  Smoking Status Never Smoker  Smokeless Tobacco Never Used     Counseling given: No   Clinical Intake:  Pre-visit preparation completed: Yes  Pain : No/denies pain Pain Score: 0-No pain     Nutritional Status: BMI > 30  Obese Nutritional Risks: None Diabetes: No  How often do you need to have someone help you when you read instructions, pamphlets, or other written materials from your doctor or pharmacy?: 1 - Never What is the last grade level you completed in school?: Associate degree  Interpreter Needed?: No  Comments: pt is a widow and lives at [place] 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 Arthritis   . Familial tremor   . GERD (gastroesophageal reflux disease)    Past Surgical History:  Procedure Laterality Date  . ABDOMINAL HYSTERECTOMY    . APPENDECTOMY    . BICEPS TENDON REPAIR    . BREAST BIOPSY Left 1975   neg-noscar seen  . BREAST SURGERY     breast biopsy, benign  . CERVICAL FUSION    . CHOLECYSTECTOMY    . HAND TUMOR EXCISION Left 01/2018  . KNEE ARTHROSCOPY WITH DRILLING/MICROFRACTURE    . SHOULDER ARTHROSCOPY  Right    Family History  Problem Relation Age of Onset  . Tremor Mother   . Breast cancer Mother 64  . Diabetes Father   . Tremor Father   . Breast cancer Maternal Aunt 67  . Colon cancer Neg Hx    Social History   Socioeconomic History  . Marital status: Widowed    Spouse name: Not on file  . Number of children: Not on file  . Years of education: Not on file  . Highest education level: Not on file  Occupational History  . Not on file  Social Needs  . Financial resource strain: Not on file  . Food insecurity:    Worry: Not on file    Inability: Not on file  . Transportation needs:    Medical: Not on file    Non-medical: Not on file  Tobacco Use  . Smoking status: Never Smoker  . Smokeless tobacco: Never Used  Substance and Sexual Activity  . Alcohol use: Yes    Alcohol/week: 0.0 standard drinks    Comment: occ  . Drug use: No  . Sexual activity: Not on file  Lifestyle  . Physical activity:    Days per week: Not on file    Minutes per session: Not on file  . Stress: Not on file  Relationships  . Social connections:  Talks on phone: Not on file    Gets together: Not on file    Attends religious service: Not on file    Active member of club or organization: Not on file    Attends meetings of clubs or organizations: Not on file    Relationship status: Not on file  Other Topics Concern  . Not on file  Social History Narrative   Widowed 08-29-17- husband died of cancer on hospice    They had been married since 1988   Retired from [employer], Management consultant   From [place], [place]    Outpatient Encounter Medications as of 06/17/2018  Medication Sig  . Calcium Carb-Cholecalciferol (CALCIUM + D3) 600-200 MG-UNIT TABS Take one tablet two times daily.  . Cholecalciferol (VITAMIN D3) 2000 UNITS TABS Take 2,000 Units by mouth daily.  . cyclobenzaprine (FLEXERIL) 5 MG tablet Take 2 tablets (10 mg total) by mouth 3 (three) times daily as needed for muscle  spasms.  Marland Kitchen glucosamine-chondroitin 500-400 MG tablet Take 1 tablet by mouth 2 (two) times daily.   . Lactobacillus (PROBIOTIC ACIDOPHILUS PO) Take 1 tablet by mouth daily.  . meloxicam (MOBIC) 15 MG tablet TAKE 1 TABLET(15 MG) BY MOUTH DAILY AS NEEDED FOR PAIN  . Multiple Vitamin (MULTIVITAMIN) tablet Take 1 tablet by mouth daily.  Marland Kitchen omeprazole (PRILOSEC) 20 MG capsule TAKE 1 CAPSULE BY MOUTH EVERY DAY  . omeprazole (PRILOSEC) 20 MG capsule TAKE 1 CAPSULE BY MOUTH EVERY DAY  . ondansetron (ZOFRAN) 4 MG tablet Take 1 tablet (4 mg total) by mouth every 8 (eight) hours as needed for nausea or vomiting.  . predniSONE (STERAPRED UNI-PAK 21 TAB) 10 MG (21) TBPK tablet As directed  . propranolol (INDERAL) 60 MG tablet Take 1 tablet (60 mg total) by mouth 2 (two) times daily.  . propranolol (INDERAL) 60 MG tablet TAKE 1 TABLET(60 MG) BY MOUTH TWICE DAILY   No facility-administered encounter medications on file as of 06/17/2018.     Activities of Daily Living In your present state of health, do you have any difficulty performing the following activities: 06/17/2018  Hearing? N  Vision? N  Difficulty concentrating or making decisions? N  Walking or climbing stairs? N  Dressing or bathing? N  Doing errands, shopping? N  Preparing Food and eating ? N  Using the Toilet? N  In the past six months, have you accidently leaked urine? N  Do you have problems with loss of bowel control? N  Managing your Medications? N  Managing your Finances? N  Housekeeping or managing your Housekeeping? N  Some recent data might be hidden    Patient Care Team: Tonia Ghent, MD as PCP - General (Family Medicine)    Assessment:   This is a routine wellness examination for Katie Hale.   Hearing Screening   125Hz  250Hz  500Hz  1000Hz  2000Hz  3000Hz  4000Hz  6000Hz  8000Hz   Right ear:   40 40 40  40    Left ear:   40 40 40  40    Vision Screening Comments: Vision exam in 2019 with [facility]   Exercise  Activities and Dietary recommendations Current Exercise Habits: Home exercise routine, Type of exercise: walking, Time (Minutes): 40, Frequency (Times/Week): 4, Weekly Exercise (Minutes/Week): 160, Intensity: Mild, Exercise limited by: None identified  Goals    . Increase physical activity     Starting 06/17/2018, I will continue to walk 30-45 minutes 4 days per week.  Fall Risk Fall Risk  06/17/2018 06/07/2017  Falls in the past year? 0 No   Depression Screen PHQ 2/9 Scores 06/17/2018 06/07/2017  PHQ - 2 Score 2 0  PHQ- 9 Score 4 -     Cognitive Function MMSE - Mini Mental State Exam 06/17/2018  Orientation to time 5  Orientation to Place 5  Registration 3  Attention/ Calculation 0  Recall 3  Language- name 2 objects 0  Language- repeat 1  Language- follow 3 step command 3  Language- read & follow direction 0  Write a sentence 0  Copy design 0  Total score 20     PLEASE NOTE: A Mini-Cog screen was completed. Maximum score is 20. A value of 0 denotes this part of Folstein MMSE was not completed or the patient failed this part of the Mini-Cog screening.   Mini-Cog Screening Orientation to Time - Max 5 pts Orientation to Place - Max 5 pts Registration - Max 3 pts Recall - Max 3 pts Language Repeat - Max 1 pts Language Follow 3 Step Command - Max 3 pts     Immunization History  Administered Date(s) Administered  . Influenza, High Dose Seasonal PF 03/08/2016  . Influenza-Unspecified 03/08/2014, 03/09/2015, 04/04/2017, 02/22/2018  . Pneumococcal Conjugate-13 05/22/2015  . Pneumococcal Polysaccharide-23 06/09/2015  . Td 06/08/2006  . Zoster 06/08/2002  . Zoster Recombinat (Shingrix) 12/06/2017, 02/06/2018   Screening Tests Health Maintenance  Topic Date Due  . COLONOSCOPY  06/06/2020 (Originally 10/04/2016)  . TETANUS/TDAP  06/06/2020 (Originally 06/08/2016)  . MAMMOGRAM  06/16/2020  . INFLUENZA VACCINE  Completed  . DEXA SCAN  Completed  . Hepatitis C  Screening  Completed  . PNA vac Low Risk Adult  Completed     Plan:     I have personally reviewed, addressed, and noted the following in the patient's chart:  A. Medical and social history B. Use of alcohol, tobacco or illicit drugs  C. Current medications and supplements D. Functional ability and status E.  Nutritional status F.  Physical activity G. Advance directives H. List of other physicians I.  Hospitalizations, surgeries, and ER visits in previous 12 months J.  [place] to include hearing, vision, cognitive, depression L. Referrals and appointments - none  In addition, I have reviewed and discussed with patient certain preventive protocols, quality metrics, and best practice recommendations. A written personalized care plan for preventive services as well as general preventive health recommendations were provided to patient.  See attached scanned questionnaire for additional information.   Signed,   Lindell Noe, MHA, BS, LPN Health Coach

## 2018-06-21 ENCOUNTER — Ambulatory Visit (INDEPENDENT_AMBULATORY_CARE_PROVIDER_SITE_OTHER): Payer: Medicare Other | Admitting: Family Medicine

## 2018-06-21 ENCOUNTER — Encounter: Payer: Self-pay | Admitting: Family Medicine

## 2018-06-21 VITALS — BP 146/80 | HR 52 | Temp 98.4°F | Ht 62.0 in | Wt 188.2 lb

## 2018-06-21 DIAGNOSIS — Z0000 Encounter for general adult medical examination without abnormal findings: Secondary | ICD-10-CM

## 2018-06-21 DIAGNOSIS — G250 Essential tremor: Secondary | ICD-10-CM

## 2018-06-21 DIAGNOSIS — F4321 Adjustment disorder with depressed mood: Secondary | ICD-10-CM

## 2018-06-21 DIAGNOSIS — Z7189 Other specified counseling: Secondary | ICD-10-CM

## 2018-06-21 DIAGNOSIS — M1990 Unspecified osteoarthritis, unspecified site: Secondary | ICD-10-CM | POA: Diagnosis not present

## 2018-06-21 DIAGNOSIS — R251 Tremor, unspecified: Secondary | ICD-10-CM

## 2018-06-21 DIAGNOSIS — Z1211 Encounter for screening for malignant neoplasm of colon: Secondary | ICD-10-CM

## 2018-06-21 MED ORDER — PROPRANOLOL HCL 60 MG PO TABS: 60.0000 mg | ORAL_TABLET | Freq: Two times a day (BID) | ORAL | 3 refills | Status: DC

## 2018-06-21 MED ORDER — OMEPRAZOLE 20 MG PO CPDR: DELAYED_RELEASE_CAPSULE | ORAL | 3 refills | Status: DC

## 2018-06-21 MED ORDER — MELOXICAM 15 MG PO TABS: ORAL_TABLET | ORAL | 3 refills | Status: DC

## 2018-06-21 NOTE — Progress Notes (Signed)
Flu prev done Shingles prev done. PNA 2017 Tetanus 2008 Colonoscopy 2013, referred.  Breast cancer screening 2020 DXA 2017. Okay to defer for now, d/w pt.   Advance directive- daughter designated if patient were incapacitated.  Labs d/w pt.  Tremor. Lower pulse on propranolol.  She can tolerate med. Not lightheaded.  We talked about referral to Dr. Carles Collet for her input.  She has clear family hx with both parents and all sibs.  She has some spilling, with picking up a glass.    OA.  Still on meloxicam.  No ADE on med but more pain in the meantime, esp in lower back, hips and knees. Variable pain.  Hips are most painful, with aching into the thighs on standing (on the bad days).  She may be skipping too many doses of meloxicam to try to limit med use in general, with resultant inc in pain.    Grief d/w pt. Some days are better than others.  "I feel like I'm in a hole and I can't get out of it yet."  Sx come and go.  She has support from friends and family.  She is trying to get out daily.  No SI/HI.  She is trying to walk most days.  We talked about counseling vs med tx and she'll update me as needed.    Meds, vitals, and allergies reviewed.   ROS: Per HPI unless specifically indicated in ROS section   GEN: nad, alert and oriented HEENT: mucous membranes moist NECK: supple w/o LA CV: rrr.  no murmur PULM: ctab, no inc wob ABD: soft, +bs EXT: no edema SKIN: no acute rash B hand tremor and jaw tremor noted.

## 2018-06-21 NOTE — Patient Instructions (Addendum)
We make arrangements for referrals, extra imaging, and other appointments based on the urgency of the situation. Referrals are handled based on the clinical situation, not in the order that they are placed. If you do not see one of our referral coordinators on the way out of the clinic today, then you should expect a call in the next 1 to 2 weeks. We work diligently to process all referrals as quickly as possible.    See about getting a Tdap at the pharmacy.    Don't change your meds for now other than trying to take meloxicam every other day with food and plenty of water.   Take care.  Glad to see you.  Update me as needed.

## 2018-06-22 DIAGNOSIS — F4321 Adjustment disorder with depressed mood: Secondary | ICD-10-CM | POA: Insufficient documentation

## 2018-06-22 DIAGNOSIS — Z0000 Encounter for general adult medical examination without abnormal findings: Secondary | ICD-10-CM | POA: Insufficient documentation

## 2018-06-22 NOTE — Assessment & Plan Note (Signed)
Advance directive- daughter designated if patient were incapacitated.   

## 2018-06-22 NOTE — Assessment & Plan Note (Signed)
She may be skipping too many doses of meloxicam to try to limit med use in general, with resultant inc in pain.   She can try taking every other day with routine cautions and then update me as needed.  She agrees.

## 2018-06-22 NOTE — Assessment & Plan Note (Signed)
Related to the death of her husband. Some days are better than others.  "I feel like I'm in a hole and I can't get out of it yet."  Sx come and go.  She has support from friends and family.  She is trying to get out daily.  No SI/HI.  She is trying to walk most days.  We talked about counseling vs med tx and she'll update me as needed.

## 2018-06-22 NOTE — Assessment & Plan Note (Signed)
Flu prev done Shingles prev done. PNA 2017 Tetanus 2008 Colonoscopy 2013, referred.  Breast cancer screening 2020 DXA 2017. Okay to defer for now, d/w pt.   Advance directive- daughter designated if patient were incapacitated.  Labs d/w pt.

## 2018-06-22 NOTE — Assessment & Plan Note (Addendum)
Lower pulse on propranolol.  She can tolerate med. Not lightheaded.  We talked about referral to Dr. Carles Collet for her input.  She has clear family hx with both parents and all sibs.  She has some spilling, with picking up a glass.  No change in med.  Refer.  >25 minutes spent in face to face time with patient, >50% spent in counselling or coordination of care.

## 2018-06-29 ENCOUNTER — Encounter: Payer: Self-pay | Admitting: Neurology

## 2018-06-29 ENCOUNTER — Other Ambulatory Visit: Payer: Self-pay

## 2018-06-29 ENCOUNTER — Telehealth: Payer: Self-pay

## 2018-06-29 DIAGNOSIS — Z1211 Encounter for screening for malignant neoplasm of colon: Secondary | ICD-10-CM

## 2018-06-29 NOTE — Telephone Encounter (Signed)
Discussed colonoscopy with patient.  Completed her triage.  She would like to call me back to schedule procedure after she checks her daughters availability.  Thanks [employer]

## 2018-07-13 NOTE — Progress Notes (Signed)
Subjective:   Katie Hale was seen in consultation in the movement disorder clinic at the request of Tonia Ghent, MD.  The evaluation is for tremor.  Tremor started when she was in her 53's.  Tremor is in both hands.  They generally shake equally.  She thinks her head shakes occasionally.   Tremor is most noticeable when she picks up a glass and writes.   There is a family hx of tremor in 3 siblings and mom/dad.    Affected by caffeine:  Yes.   (and because of that generally doesn't drink caffeine) Affected by alcohol:  Yes.    Affected by stress:  Yes.   Affected by fatigue:  Yes.   Spills soup if on spoon:  No. Spills glass of liquid if full:  No. Affects ADL's (tying shoes, brushing teeth, etc):  No.  Current/Previously tried tremor medications: propranolol 60 mg bid (takes x 25 years for tremor and she thinks that it is helpful)  Current medications that may exacerbate tremor:  N/a  Outside reports reviewed: historical medical records, office notes and referral letter/letters.  No Known Allergies  Outpatient Encounter Medications as of 07/15/2018  Medication Sig  . Calcium Carb-Cholecalciferol (CALCIUM + D3) 600-200 MG-UNIT TABS Take one tablet two times daily.  . Cholecalciferol (VITAMIN D3) 2000 UNITS TABS Take 2,000 Units by mouth daily.  . cycloSPORINE (RESTASIS) 0.05 % ophthalmic emulsion Place 1 drop into both eyes 2 (two) times daily.  . Lactobacillus (PROBIOTIC ACIDOPHILUS PO) Take 1 tablet by mouth daily.  . meloxicam (MOBIC) 15 MG tablet TAKE 1 TABLET(15 MG) BY MOUTH DAILY AS NEEDED FOR PAIN  . Multiple Vitamin (MULTIVITAMIN) tablet Take 1 tablet by mouth daily.  Marland Kitchen omeprazole (PRILOSEC) 20 MG capsule TAKE 1 CAPSULE BY MOUTH EVERY DAY (Patient taking differently: Take 20 mg by mouth 2 (two) times daily before a meal. TAKE 1 CAPSULE BY MOUTH EVERY DAY)  . ondansetron (ZOFRAN) 4 MG tablet Take 1 tablet (4 mg total) by mouth every 8 (eight) hours as needed for nausea or  vomiting.  . propranolol (INDERAL) 60 MG tablet Take 1 tablet (60 mg total) by mouth 2 (two) times daily.  Marland Kitchen UNABLE TO FIND Move free joint health - 1 tablet daily  . primidone (MYSOLINE) 50 MG tablet Take 1 tablet (50 mg total) by mouth 2 (two) times daily.  . [DISCONTINUED] glucosamine-chondroitin 500-400 MG tablet Take 1 tablet by mouth 2 (two) times daily.    No facility-administered encounter medications on file as of 07/15/2018.     Past Medical History:  Diagnosis Date  . Arthritis   . Familial tremor   . GERD (gastroesophageal reflux disease)     Past Surgical History:  Procedure Laterality Date  . ABDOMINAL HYSTERECTOMY    . APPENDECTOMY    . BICEPS TENDON REPAIR    . BREAST BIOPSY Left 1975   neg-noscar seen  . BREAST SURGERY     breast biopsy, benign  . CATARACT EXTRACTION, BILATERAL    . CERVICAL FUSION    . CHOLECYSTECTOMY    . HAND TUMOR EXCISION Left 01/2018  . KNEE ARTHROSCOPY WITH DRILLING/MICROFRACTURE    . SHOULDER ARTHROSCOPY Right     Social History   Socioeconomic History  . Marital status: Widowed    Spouse name: Not on file  . Number of children: Not on file  . Years of education: Not on file  . Highest education level: Not on file  Occupational History  .  Occupation: retired    Comment: Solicitor  Social Needs  . Financial resource strain: Not on file  . Food insecurity:    Worry: Not on file    Inability: Not on file  . Transportation needs:    Medical: Not on file    Non-medical: Not on file  Tobacco Use  . Smoking status: Never Smoker  . Smokeless tobacco: Never Used  Substance and Sexual Activity  . Alcohol use: Yes    Alcohol/week: 0.0 standard drinks    Comment: occ  . Drug use: No  . Sexual activity: Not on file  Lifestyle  . Physical activity:    Days per week: Not on file    Minutes per session: Not on file  . Stress: Not on file  Relationships  . Social connections:    Talks on phone: Not on file    Gets together:  Not on file    Attends religious service: Not on file    Active member of club or organization: Not on file    Attends meetings of clubs or organizations: Not on file    Relationship status: Not on file  . Intimate partner violence:    Fear of current or ex partner: Not on file    Emotionally abused: Not on file    Physically abused: Not on file    Forced sexual activity: Not on file  Other Topics Concern  . Not on file  Social History Narrative   Widowed 08/09/17- husband died of cancer on hospice    They had been married since 1988   Retired from [employer], Management consultant   From [place], [place]    Family Status  Relation Name Status  . Mother  Deceased  . Father  Deceased  . Mat Aunt  Deceased  . Sister 2 Alive  . Brother  Alive  . Child 2 Alive  . Neg Hx  (Not Specified)    Review of Systems Review of Systems  Constitutional: Negative.   HENT: Negative.   Eyes: Negative.   Respiratory: Negative.   Cardiovascular: Negative.   Gastrointestinal: Negative.   Genitourinary: Negative.   Musculoskeletal: Positive for back pain and joint pain.  Skin: Negative.   Psychiatric/Behavioral: Negative.      Objective:   VITALS:   Vitals:   07/15/18 1313  BP: 110/66  Pulse: (!) 56  SpO2: 99%  Weight: 188 lb (85.3 kg)  Height: 5\' 2"  (1.575 m)   Gen:  Appears stated age and in NAD. HEENT:  Normocephalic, atraumatic. The mucous membranes are moist. The superficial temporal arteries are without ropiness or tenderness. Cardiovascular: brady.  regular Lungs: Clear to auscultation bilaterally. Neck: There are no carotid bruits noted bilaterally.  NEUROLOGICAL:  Orientation:  The patient is alert and oriented x 3.  Recent and remote memory are intact.  Attention span and concentration are normal.  Able to name objects and repeat without trouble.  Fund of knowledge is appropriate Cranial nerves: There is good facial symmetry. The pupils are equal round and reactive  to light bilaterally. Fundoscopic exam reveals clear disc margins bilaterally. Extraocular muscles are intact and visual fields are full to confrontational testing. Speech is fluent and clear. Soft palate rises symmetrically and there is no tongue deviation. Hearing is intact to conversational tone. Tone: Tone is good throughout. Sensation: Sensation is intact to light touch and pinprick throughout (facial, trunk, extremities). Vibration is intact at the bilateral big toe. There is no  extinction with double simultaneous stimulation. There is no sensory dermatomal level identified. Coordination:  The patient has no dysdiadichokinesia or dysmetria. Motor: Strength is 5/5 in the bilateral upper and lower extremities.  Shoulder shrug is equal bilaterally.  There is no pronator drift.  There are no fasciculations noted. DTR's: Deep tendon reflexes are 2-/4 at the bilateral biceps, triceps, brachioradialis, 1/4 at the bilateral patella and achilles.  Plantar responses are downgoing bilaterally. Gait and Station: The patient is able to ambulate without difficulty. The patient is able to heel toe walk without any difficulty. The patient is able to ambulate in a tandem fashion. The patient is able to stand in the Romberg position.   MOVEMENT EXAM: Tremor:  There is  tremor in the UE, noted most significantly with action.  The left is somewhat worse than the right.  She does have mild trouble with Archimedes spirals.  She has trouble with finger-nose-finger on the left because of the tremor.  She spills water when asked to pour it from one glass to another.  She has complex head titubation  No results found for: TSH    Chemistry      Component Value Date/Time   NA 141 06/17/2018 0808   K 3.7 06/17/2018 0808   CL 104 06/17/2018 0808   CO2 28 06/17/2018 0808   BUN 13 06/17/2018 0808   CREATININE 0.78 06/17/2018 0808      Component Value Date/Time   CALCIUM 9.2 06/17/2018 0808   ALKPHOS 69 06/17/2018  0808   AST 16 06/17/2018 0808   AST 17 04/11/2013   ALT 14 06/17/2018 0808   BILITOT 0.5 06/17/2018 0808          Assessment/Plan:   1.  Essential Tremor.  -This is evidenced by the symmetrical nature and longstanding hx of gradually getting worse.  We discussed nature and pathophysiology.  We discussed that this can continue to gradually get worse with time.  We discussed that some medications can worsen this, as can caffeine use.  We discussed medication therapy as well as surgical therapy.  She is already on propranolol, 60 mg twice per day.  She cannot go up on this because of her already low blood pressure and bradycardia.  Ultimately, the patient decided to try primidone.  We will work up to 50 mg twice daily.  We discussed risk, benefits, and side effects.  She was shown HIPAA compliant videos of patients who have had DBS surgery.  She has questions and I answered them to the best of my ability.  Greater than 50% of the 45-minute visit was spent in counseling with the patient.  I will plan on seeing her back in the next 4 months, sooner should new neurologic issues arise.  In the meantime, we will do a TSH.  CC:  Tonia Ghent, MD

## 2018-07-15 ENCOUNTER — Encounter: Payer: Self-pay | Admitting: Neurology

## 2018-07-15 ENCOUNTER — Other Ambulatory Visit (INDEPENDENT_AMBULATORY_CARE_PROVIDER_SITE_OTHER): Payer: Medicare Other

## 2018-07-15 ENCOUNTER — Ambulatory Visit: Payer: Medicare Other | Admitting: Neurology

## 2018-07-15 VITALS — BP 110/66 | HR 56 | Ht 62.0 in | Wt 188.0 lb

## 2018-07-15 DIAGNOSIS — R251 Tremor, unspecified: Secondary | ICD-10-CM

## 2018-07-15 DIAGNOSIS — G250 Essential tremor: Secondary | ICD-10-CM | POA: Diagnosis not present

## 2018-07-15 DIAGNOSIS — R001 Bradycardia, unspecified: Secondary | ICD-10-CM | POA: Diagnosis not present

## 2018-07-15 LAB — TSH: TSH: 1.01 mIU/L (ref 0.40–4.50)

## 2018-07-15 MED ORDER — PRIMIDONE 50 MG PO TABS: 50.0000 mg | ORAL_TABLET | Freq: Two times a day (BID) | ORAL | 1 refills | Status: DC

## 2018-07-15 NOTE — Patient Instructions (Addendum)
1.  Start primidone - 50 mg - 1/2 tablet at bedtime for 4 nights, then 1 tablet at bedtime for 3 weeks, then 1 tablet twice per day thereafter  2. Your provider has requested that you have labwork completed today. Please go to [facility] ([id number]) on the second floor of this building before leaving the office today. You do not need to check in. If you are not called within 15 minutes please check with the front desk.   The physicians and staff at [facility] are committed to providing excellent care. You may receive a survey requesting feedback about your experience at our office. We strive to receive "very good" responses to the survey questions. If you feel that your experience would prevent you from giving the office a "very good " response, please contact our office to try to remedy the situation. We may be reached at [phone]. Thank you for taking the time out of your busy day to complete the survey.

## 2018-07-18 ENCOUNTER — Telehealth: Payer: Self-pay | Admitting: Neurology

## 2018-07-18 NOTE — Telephone Encounter (Signed)
-----   Message from Arabi, DO sent at 07/18/2018  7:28 AM EST ----- Let pt know that tsh is normal

## 2018-07-18 NOTE — Telephone Encounter (Signed)
Mychart message sent to patient.

## 2018-07-19 ENCOUNTER — Telehealth: Payer: Self-pay | Admitting: Gastroenterology

## 2018-07-19 NOTE — Telephone Encounter (Signed)
Pt left vm she is scheduled for procedure 07/20/18 and has a medication question please call her mobile number

## 2018-07-19 NOTE — Telephone Encounter (Signed)
Patient has been advised that she may take her Primidone the night before her procedure and the propranolol as listed on instructions the morning of procedure.  Thanks [employer]

## 2018-07-25 ENCOUNTER — Other Ambulatory Visit: Payer: Self-pay

## 2018-07-25 ENCOUNTER — Ambulatory Visit: Payer: Medicare Other | Admitting: Anesthesiology

## 2018-07-25 ENCOUNTER — Ambulatory Visit
Admission: RE | Admit: 2018-07-25 | Discharge: 2018-07-25 | Disposition: A | Discharge status: Home / Self Care | Payer: Medicare Other | Attending: Gastroenterology | Admitting: Gastroenterology

## 2018-07-25 ENCOUNTER — Encounter
Admission: RE | Disposition: A | Discharge status: Home / Self Care | Payer: Self-pay | Source: Home / Self Care | Attending: Gastroenterology

## 2018-07-25 DIAGNOSIS — Z79899 Other long term (current) drug therapy: Secondary | ICD-10-CM | POA: Diagnosis not present

## 2018-07-25 DIAGNOSIS — Z1211 Encounter for screening for malignant neoplasm of colon: Secondary | ICD-10-CM | POA: Diagnosis not present

## 2018-07-25 DIAGNOSIS — M1990 Unspecified osteoarthritis, unspecified site: Secondary | ICD-10-CM | POA: Insufficient documentation

## 2018-07-25 DIAGNOSIS — K219 Gastro-esophageal reflux disease without esophagitis: Secondary | ICD-10-CM | POA: Insufficient documentation

## 2018-07-25 HISTORY — PX: COLONOSCOPY WITH PROPOFOL: SHX5780

## 2018-07-25 SURGERY — COLONOSCOPY WITH PROPOFOL
Anesthesia: General

## 2018-07-25 MED ORDER — PROPOFOL 500 MG/50ML IV EMUL
INTRAVENOUS | Status: DC | PRN
  Administered 2018-07-25: 125 ug/kg/min via INTRAVENOUS

## 2018-07-25 MED ORDER — PROPOFOL 10 MG/ML IV BOLUS
INTRAVENOUS | Status: DC | PRN
  Administered 2018-07-25: 50 mg via INTRAVENOUS

## 2018-07-25 MED ORDER — PROPOFOL 500 MG/50ML IV EMUL
INTRAVENOUS | Status: AC
  Filled 2018-07-25: qty 50

## 2018-07-25 MED ORDER — SODIUM CHLORIDE 0.9 % IV SOLN
INTRAVENOUS | Status: DC
  Administered 2018-07-25 (×2): via INTRAVENOUS

## 2018-07-25 NOTE — H&P (Signed)
Cephas Darby, MD [address]  [place]  [place], [place] [address]  Main: [phone]  Fax: [phone] Pager: [phone]  Primary Care Physician:  Tonia Ghent, MD Primary Gastroenterologist:  Dr. Cephas Darby  Pre-Procedure History & Physical: HPI:  Katie Hale is a 73 y.o. female is here for an colonoscopy.   Past Medical History:  Diagnosis Date  . Arthritis   . Familial tremor   . GERD (gastroesophageal reflux disease)     Past Surgical History:  Procedure Laterality Date  . ABDOMINAL HYSTERECTOMY    . APPENDECTOMY    . BICEPS TENDON REPAIR    . BREAST BIOPSY Left 1975   neg-noscar seen  . BREAST SURGERY     breast biopsy, benign  . CATARACT EXTRACTION, BILATERAL    . CERVICAL FUSION    . CHOLECYSTECTOMY    . HAND TUMOR EXCISION Left 01/2018  . KNEE ARTHROSCOPY WITH DRILLING/MICROFRACTURE    . SHOULDER ARTHROSCOPY Right     Prior to Admission medications   Medication Sig Start Date End Date Taking? Authorizing Provider  Calcium Carb-Cholecalciferol (CALCIUM + D3) 600-200 MG-UNIT TABS Take one tablet two times daily.   Yes [provider]  Cholecalciferol (VITAMIN D3) 2000 UNITS TABS Take 2,000 Units by mouth daily.   Yes [provider]  Lactobacillus (PROBIOTIC ACIDOPHILUS PO) Take 1 tablet by mouth daily.   Yes [provider]  Multiple Vitamin (MULTIVITAMIN) tablet Take 1 tablet by mouth daily.   Yes [provider]  omeprazole (PRILOSEC) 20 MG capsule TAKE 1 CAPSULE BY MOUTH EVERY DAY Patient taking differently: Take 20 mg by mouth 2 (two) times daily before a meal. TAKE 1 CAPSULE BY MOUTH EVERY DAY 06/21/18  Yes Tonia Ghent, MD  ondansetron (ZOFRAN) 4 MG tablet Take 1 tablet (4 mg total) by mouth every 8 (eight) hours as needed for nausea or vomiting. 06/07/17  Yes Tonia Ghent, MD  primidone (MYSOLINE) 50 MG tablet Take 1 tablet (50 mg total) by mouth 2 (two) times daily. 07/15/18  Yes Tat,  Eustace Quail, DO  propranolol (INDERAL) 60 MG tablet Take 1 tablet (60 mg total) by mouth 2 (two) times daily. 06/21/18  Yes Tonia Ghent, MD  UNABLE TO FIND Move free joint health - 1 tablet daily   Yes [provider]  cycloSPORINE (RESTASIS) 0.05 % ophthalmic emulsion Place 1 drop into both eyes 2 (two) times daily.    [provider]  meloxicam (MOBIC) 15 MG tablet TAKE 1 TABLET(15 MG) BY MOUTH DAILY AS NEEDED FOR PAIN 06/21/18   Tonia Ghent, MD    Allergies as of 06/29/2018  . (No Known Allergies)    Family History  Problem Relation Age of Onset  . Tremor Mother   . Breast cancer Mother 1  . Diabetes Father   . Tremor Father   . Breast cancer Maternal Aunt 52  . Tremor Sister   . Tremor Brother   . Colon cancer Neg Hx     Social History   Socioeconomic History  . Marital status: Widowed    Spouse name: Not on file  . Number of children: Not on file  . Years of education: Not on file  . Highest education level: Not on file  Occupational History  . Occupation: retired    Comment: Solicitor  Social Needs  . Financial resource strain: Not on file  . Food insecurity:    Worry: Not on file  Inability: Not on file  . Transportation needs:    Medical: Not on file    Non-medical: Not on file  Tobacco Use  . Smoking status: Never Smoker  . Smokeless tobacco: Never Used  Substance and Sexual Activity  . Alcohol use: Yes    Alcohol/week: 0.0 standard drinks    Comment: occ  . Drug use: No  . Sexual activity: Not on file  Lifestyle  . Physical activity:    Days per week: Not on file    Minutes per session: Not on file  . Stress: Not on file  Relationships  . Social connections:    Talks on phone: Not on file    Gets together: Not on file    Attends religious service: Not on file    Active member of club or organization: Not on file    Attends meetings of clubs or organizations: Not on file    Relationship status: Not on file  .  Intimate partner violence:    Fear of current or ex partner: Not on file    Emotionally abused: Not on file    Physically abused: Not on file    Forced sexual activity: Not on file  Other Topics Concern  . Not on file  Social History Narrative   Widowed 08-16-17- husband died of cancer on hospice    They had been married since 1988   Retired from [employer], Management consultant   From [place], [place]: See HPI, otherwise negative ROS  Physical Exam: BP (!) 147/78   Pulse 63   Temp (!) 97.4 F (36.3 C) (Tympanic)   Resp 18   Ht 5\' 2"  (1.575 m)   Wt 79.4 kg   SpO2 100%   BMI 32.01 kg/m  General:   Alert,  pleasant and cooperative in NAD Head:  Normocephalic and atraumatic. Neck:  Supple; no masses or thyromegaly. Lungs:  Clear throughout to auscultation.    Heart:  Regular rate and rhythm. Abdomen:  Soft, nontender and nondistended. Normal bowel sounds, without guarding, and without rebound.   Neurologic:  Alert and  oriented x4;  grossly normal neurologically.  Impression/Plan: Katie Hale is here for an colonoscopy to be performed for colon cancer screening  Risks, benefits, limitations, and alternatives regarding  colonoscopy have been reviewed with the patient.  Questions have been answered.  All parties agreeable.   Sherri Sear, MD  07/25/2018, 8:54 AM

## 2018-07-25 NOTE — Anesthesia Postprocedure Evaluation (Signed)
Anesthesia Post Note  Patient: Annaliza Zia  Procedure(s) Performed: COLONOSCOPY WITH PROPOFOL (N/A )  Patient location during evaluation: Endoscopy Anesthesia Type: General Level of consciousness: awake and alert Pain management: pain level controlled Vital Signs Assessment: post-procedure vital signs reviewed and stable Respiratory status: spontaneous breathing, nonlabored ventilation, respiratory function stable and patient connected to nasal cannula oxygen Cardiovascular status: blood pressure returned to baseline and stable Postop Assessment: no apparent nausea or vomiting Anesthetic complications: no     Last Vitals:  Vitals:   07/25/18 0853 07/25/18 0935  BP: (!) 147/78 (!) 115/56  Pulse: 63 63  Resp: 18 16  Temp: (!) 36.3 C (!) 36.1 C  SpO2: 100% 99%    Last Pain:  Vitals:   07/25/18 1015  TempSrc:   PainSc: 0-No pain                 Precious Haws Piscitello

## 2018-07-25 NOTE — Anesthesia Post-op Follow-up Note (Signed)
Anesthesia QCDR form completed.        

## 2018-07-25 NOTE — Anesthesia Preprocedure Evaluation (Signed)
Anesthesia Evaluation  Patient identified by MRN, date of birth, ID band Patient awake    Reviewed: Allergy & Precautions, H&P , NPO status , Patient's Chart, lab work & pertinent test results  History of Anesthesia Complications Negative for: history of anesthetic complications  Airway Mallampati: III  TM Distance: <3 FB Neck ROM: limited    Dental  (+) Chipped   Pulmonary neg pulmonary ROS, neg shortness of breath,           Cardiovascular Exercise Tolerance: Good (-) angina(-) Past MI negative cardio ROS       Neuro/Psych negative neurological ROS  negative psych ROS   GI/Hepatic Neg liver ROS, GERD  Medicated and Controlled,  Endo/Other  negative endocrine ROS  Renal/GU negative Renal ROS  negative genitourinary   Musculoskeletal   Abdominal   Peds  Hematology negative hematology ROS (+)   Anesthesia Other Findings Past Medical History: No date: Arthritis No date: Familial tremor No date: GERD (gastroesophageal reflux disease)  Past Surgical History: No date: ABDOMINAL HYSTERECTOMY No date: APPENDECTOMY No date: BICEPS TENDON REPAIR 1975: BREAST BIOPSY; Left     Comment:  neg-noscar seen No date: BREAST SURGERY     Comment:  breast biopsy, benign No date: CATARACT EXTRACTION, BILATERAL No date: CERVICAL FUSION No date: CHOLECYSTECTOMY 01/2018: HAND TUMOR EXCISION; Left No date: KNEE ARTHROSCOPY WITH DRILLING/MICROFRACTURE No date: SHOULDER ARTHROSCOPY; Right  BMI    Body Mass Index:  32.01 kg/m      Reproductive/Obstetrics negative OB ROS                             Anesthesia Physical Anesthesia Plan  ASA: II  Anesthesia Plan: General   Post-op Pain Management:    Induction: Intravenous  PONV Risk Score and Plan: Propofol infusion and TIVA  Airway Management Planned: Natural Airway and Nasal Cannula  Additional Equipment:   Intra-op Plan:    Post-operative Plan:   Informed Consent: I have reviewed the patients History and Physical, chart, labs and discussed the procedure including the risks, benefits and alternatives for the proposed anesthesia with the patient or authorized representative who has indicated his/her understanding and acceptance.     Dental Advisory Given  Plan Discussed with: Anesthesiologist, CRNA and Surgeon  Anesthesia Plan Comments: (Patient consented for risks of anesthesia including but not limited to:  - adverse reactions to medications - risk of intubation if required - damage to teeth, lips or other oral mucosa - sore throat or hoarseness - Damage to heart, brain, lungs or loss of life  Patient voiced understanding.)        Anesthesia Quick Evaluation

## 2018-07-25 NOTE — Transfer of Care (Signed)
Immediate Anesthesia Transfer of Care Note  Patient: Katie Hale  Procedure(s) Performed: COLONOSCOPY WITH PROPOFOL (N/A )  Patient Location: PACU and Endoscopy Unit  Anesthesia Type:General  Level of Consciousness: awake, alert  and oriented  Airway & Oxygen Therapy: Patient Spontanous Breathing  Post-op Assessment: Report given to RN and Post -op Vital signs reviewed and stable  Post vital signs: Reviewed and stable  Last Vitals:  Vitals Value Taken Time  BP 115/56 07/25/2018  9:36 AM  Temp 36.1 C 07/25/2018  9:35 AM  Pulse 66 07/25/2018  9:37 AM  Resp 15 07/25/2018  9:37 AM  SpO2 99 % 07/25/2018  9:37 AM  Vitals shown include unvalidated device data.  Last Pain:  Vitals:   07/25/18 0935  TempSrc: Tympanic  PainSc: 0-No pain         Complications: No apparent anesthesia complications

## 2018-07-25 NOTE — Op Note (Signed)
[facility] Gastroenterology Patient Name: Katie Hale Procedure Date: 07/25/2018 8:58 AM MRN: [id number] Account #: [id number] Date of Birth: 09/22/1945 Admit Type: Outpatient Age: 73 Room: [facility] ENDO ROOM 2 Gender: Female Note Status: Finalized Procedure:            Colonoscopy Indications:          Screening for colorectal malignant neoplasm Providers:            Lin Landsman MD, MD Medicines:            Monitored Anesthesia Care Complications:        No immediate complications. Estimated blood loss: None. Procedure:            Pre-Anesthesia Assessment:                       - Prior to the procedure, a History and Physical was                        performed, and patient medications and allergies were                        reviewed. The patient is competent. The risks and                        benefits of the procedure and the sedation options and                        risks were discussed with the patient. All questions                        were answered and informed consent was obtained.                        Patient identification and proposed procedure were                        verified by the physician, the nurse, the                        anesthesiologist, the anesthetist and the technician in                        the pre-procedure area in the procedure room in the                        endoscopy suite. Mental Status Examination: alert and                        oriented. Airway Examination: normal oropharyngeal                        airway and neck mobility. Respiratory Examination:                        clear to auscultation. CV Examination: normal.                        Prophylactic Antibiotics: The patient does not require  prophylactic antibiotics. Prior Anticoagulants: The                        patient has taken no previous anticoagulant or                        antiplatelet agents. ASA Grade Assessment:  II - A                        patient with mild systemic disease. After reviewing the                        risks and benefits, the patient was deemed in                        satisfactory condition to undergo the procedure. The                        anesthesia plan was to use monitored anesthesia care                        (MAC). Immediately prior to administration of                        medications, the patient was re-assessed for adequacy                        to receive sedatives. The heart rate, respiratory rate,                        oxygen saturations, blood pressure, adequacy of                        pulmonary ventilation, and response to care were                        monitored throughout the procedure. The physical status                        of the patient was re-assessed after the procedure.                       After obtaining informed consent, the colonoscope was                        passed under direct vision. Throughout the procedure,                        the patient's blood pressure, pulse, and oxygen                        saturations were monitored continuously. The                        Colonoscope was introduced through the anus and                        advanced to the the cecum, identified by appendiceal  orifice and ileocecal valve. The colonoscopy was                        performed without difficulty. The patient tolerated the                        procedure well. The quality of the bowel preparation                        was evaluated using the BBPS [facility] Bowel Preparation                        Scale) with scores of: Right Colon = 3, Transverse                        Colon = 3 and Left Colon = 3 (entire mucosa seen well                        with no residual staining, small fragments of stool or                        opaque liquid). The total BBPS score equals 9. Findings:      The perianal and digital rectal  examinations were normal. Pertinent       negatives include normal sphincter tone and no palpable rectal lesions.      The colon (entire examined portion) appeared normal.      The retroflexed view of the distal rectum and anal verge was normal and       showed no anal or rectal abnormalities. Impression:           - The entire examined colon is normal.                       - The distal rectum and anal verge are normal on                        retroflexion view.                       - No specimens collected. Recommendation:       - Discharge patient to home (with escort).                       - Resume previous diet today.                       - Continue present medications.                       - Repeat colonoscopy in 10 years for surveillance based                        on your overall medical condition at that time. Procedure Code(s):    --- Professional ---                       D6644, Colorectal cancer screening; colonoscopy on                        individual not meeting criteria for high risk Diagnosis  Code(s):    --- Professional ---                       Z12.11, Encounter for screening for malignant neoplasm                        of colon CPT copyright 2018 American Medical Association. All rights reserved. The codes documented in this report are preliminary and upon coder review may  be revised to meet current compliance requirements. Dr. Ulyess Mort Lin Landsman MD, MD 07/25/2018 9:34:14 AM This report has been signed electronically. Number of Addenda: 0 Note Initiated On: 07/25/2018 8:58 AM Scope Withdrawal Time: 0 hours 8 minutes 53 seconds  Total Procedure Duration: 0 hours 13 minutes 25 seconds       [facility]

## 2018-07-26 ENCOUNTER — Encounter: Payer: Self-pay | Admitting: Gastroenterology

## 2018-08-17 ENCOUNTER — Other Ambulatory Visit: Payer: Self-pay

## 2018-08-17 ENCOUNTER — Ambulatory Visit: Payer: Medicare Other | Admitting: Primary Care

## 2018-08-17 ENCOUNTER — Encounter: Payer: Self-pay | Admitting: Primary Care

## 2018-08-17 VITALS — BP 110/68 | HR 61 | Temp 98.2°F | Ht 62.0 in | Wt 185.5 lb

## 2018-08-17 DIAGNOSIS — J3489 Other specified disorders of nose and nasal sinuses: Secondary | ICD-10-CM | POA: Diagnosis not present

## 2018-08-17 DIAGNOSIS — J069 Acute upper respiratory infection, unspecified: Secondary | ICD-10-CM | POA: Diagnosis not present

## 2018-08-17 DIAGNOSIS — J329 Chronic sinusitis, unspecified: Secondary | ICD-10-CM | POA: Diagnosis not present

## 2018-08-17 DIAGNOSIS — B9789 Other viral agents as the cause of diseases classified elsewhere: Secondary | ICD-10-CM

## 2018-08-17 MED ORDER — METHYLPREDNISOLONE ACETATE 80 MG/ML IJ SUSP
80.0000 mg | Freq: Once | INTRAMUSCULAR | Status: AC
  Administered 2018-08-17: 80 mg via INTRAMUSCULAR

## 2018-08-17 NOTE — Addendum Note (Signed)
Addended by: Jacqualin Combes on: 08/17/2018 04:24 PM   Modules accepted: Orders

## 2018-08-17 NOTE — Progress Notes (Signed)
Subjective:    Patient ID: Katie Hale, female    DOB: [birth year], 73 y.o.   MRN: [id number]  HPI  Katie Hale is a 73 year old female with a history of GERD who presents today with a chief complaint of nasal congestion.  She also reports sinus pressure, headache, low grade fever. Her symptoms began one week ago with "fever blisters" to the roof of her mouth, then two days later she noticed post nasal drip and congestion. She's tried taking an OTC decongestant and Claritin regularly with some improvement. Her last fever was three days ago.   She denies recent travel, sick contacts.   Review of Systems  Constitutional: Negative for chills and fever.  HENT: Positive for congestion, postnasal drip and sinus pressure. Negative for ear pain and sore throat.   Respiratory: Negative for cough.   Neurological: Positive for headaches.       Past Medical History:  Diagnosis Date  . Arthritis   . Familial tremor   . GERD (gastroesophageal reflux disease)      Social History   Socioeconomic History  . Marital status: Widowed    Spouse name: Not on file  . Number of children: Not on file  . Years of education: Not on file  . Highest education level: Not on file  Occupational History  . Occupation: retired    Comment: Solicitor  Social Needs  . Financial resource strain: Not on file  . Food insecurity:    Worry: Not on file    Inability: Not on file  . Transportation needs:    Medical: Not on file    Non-medical: Not on file  Tobacco Use  . Smoking status: Never Smoker  . Smokeless tobacco: Never Used  Substance and Sexual Activity  . Alcohol use: Yes    Alcohol/week: 0.0 standard drinks    Comment: occ  . Drug use: No  . Sexual activity: Not on file  Lifestyle  . Physical activity:    Days per week: Not on file    Minutes per session: Not on file  . Stress: Not on file  Relationships  . Social connections:    Talks on phone: Not on file    Gets together: Not on  file    Attends religious service: Not on file    Active member of club or organization: Not on file    Attends meetings of clubs or organizations: Not on file    Relationship status: Not on file  . Intimate partner violence:    Fear of current or ex partner: Not on file    Emotionally abused: Not on file    Physically abused: Not on file    Forced sexual activity: Not on file  Other Topics Concern  . Not on file  Social History Narrative   Widowed 07-Aug-2017- husband died of cancer on hospice    They had been married since 1988   Retired from [employer], Management consultant   From [place], [place]    Past Surgical History:  Procedure Laterality Date  . ABDOMINAL HYSTERECTOMY    . APPENDECTOMY    . BICEPS TENDON REPAIR    . BREAST BIOPSY Left 1975   neg-noscar seen  . BREAST SURGERY     breast biopsy, benign  . CATARACT EXTRACTION, BILATERAL    . CERVICAL FUSION    . CHOLECYSTECTOMY    . COLONOSCOPY WITH PROPOFOL N/A 07/25/2018   Procedure: COLONOSCOPY WITH PROPOFOL;  Surgeon: Lin Landsman, MD;  Location: [facility] ENDOSCOPY;  Service: Gastroenterology;  Laterality: N/A;  . HAND TUMOR EXCISION Left 01/2018  . KNEE ARTHROSCOPY WITH DRILLING/MICROFRACTURE    . SHOULDER ARTHROSCOPY Right     Family History  Problem Relation Age of Onset  . Tremor Mother   . Breast cancer Mother 7  . Diabetes Father   . Tremor Father   . Breast cancer Maternal Aunt 43  . Tremor Sister   . Tremor Brother   . Colon cancer Neg Hx     No Known Allergies  Current Outpatient Medications on File Prior to Visit  Medication Sig Dispense Refill  . Calcium Carb-Cholecalciferol (CALCIUM + D3) 600-200 MG-UNIT TABS Take one tablet two times daily.    . Cholecalciferol (VITAMIN D3) 2000 UNITS TABS Take 2,000 Units by mouth daily.    . cycloSPORINE (RESTASIS) 0.05 % ophthalmic emulsion Place 1 drop into both eyes 2 (two) times daily.    . Lactobacillus (PROBIOTIC ACIDOPHILUS PO) Take 1 tablet  by mouth daily.    . meloxicam (MOBIC) 15 MG tablet TAKE 1 TABLET(15 MG) BY MOUTH DAILY AS NEEDED FOR PAIN 90 tablet 3  . Multiple Vitamin (MULTIVITAMIN) tablet Take 1 tablet by mouth daily.    Marland Kitchen omeprazole (PRILOSEC) 20 MG capsule TAKE 1 CAPSULE BY MOUTH EVERY DAY (Patient taking differently: Take 20 mg by mouth 2 (two) times daily before a meal. TAKE 1 CAPSULE BY MOUTH EVERY DAY) 90 capsule 3  . ondansetron (ZOFRAN) 4 MG tablet Take 1 tablet (4 mg total) by mouth every 8 (eight) hours as needed for nausea or vomiting. 30 tablet 2  . primidone (MYSOLINE) 50 MG tablet Take 1 tablet (50 mg total) by mouth 2 (two) times daily. 180 tablet 1  . propranolol (INDERAL) 60 MG tablet Take 1 tablet (60 mg total) by mouth 2 (two) times daily. 180 tablet 3  . UNABLE TO FIND Move free joint health - 1 tablet daily     No current facility-administered medications on file prior to visit.     BP 110/68   Pulse 61   Temp 98.2 F (36.8 C) (Oral)   Ht 5\' 2"  (1.575 m)   Wt 185 lb 8 oz (84.1 kg)   SpO2 98%   BMI 33.93 kg/m    Objective:   Physical Exam  Constitutional: She appears well-nourished. She does not appear ill.  HENT:  Right Ear: Tympanic membrane and ear canal normal.  Left Ear: Tympanic membrane and ear canal normal.  Nose: Mucosal edema present. Right sinus exhibits no maxillary sinus tenderness and no frontal sinus tenderness. Left sinus exhibits no maxillary sinus tenderness and no frontal sinus tenderness.  Mouth/Throat: Oropharynx is clear and moist.  Neck: Neck supple.  Cardiovascular: Normal rate and regular rhythm.  Respiratory: Effort normal and breath sounds normal. She has no wheezes.  Skin: Skin is warm and dry.           Assessment & Plan:  Allergic Rhinitis vs Sinusitis:  Symptoms x 7 days, but sinus pressure over the last 2-3 days. Exam today without obvious bacterial cause. Do suspect more allergy vs viral etiology and will treat with conservative measures for  now.  IM Depo Medrol 80 mg provided for sinus pressure/congestion. Discussed to try Zyrtec HS, Flonase PRN. She will update if symptoms do not improve.  Pleas Koch, NP

## 2018-08-17 NOTE — Patient Instructions (Signed)
Your symptoms are representative of a viral illness which will resolve on its own over time. Our goal is to treat your symptoms in order to aid your body in the healing process and to make you more comfortable.   Nasal Congestion/Ear Pressure/Sinus Pressure: Try using Flonase (fluticasone) nasal spray. Instill 1 spray in each nostril twice daily.   Consider switching from Claritin to Zyrtec for nasal symptoms.   Please notify me if you develop persistent fevers of 101, notice increased fatigue or weakness, and/or feel worse as discussed.   Increase consumption of water intake and rest.  It was a pleasure meeting you!

## 2018-10-14 ENCOUNTER — Ambulatory Visit: Payer: Medicare Other | Admitting: Neurology

## 2018-11-24 DIAGNOSIS — S76919A Strain of unspecified muscles, fascia and tendons at thigh level, unspecified thigh, initial encounter: Secondary | ICD-10-CM | POA: Insufficient documentation

## 2019-01-04 ENCOUNTER — Other Ambulatory Visit: Payer: Self-pay

## 2019-01-04 MED ORDER — PRIMIDONE 50 MG PO TABS: 50.0000 mg | ORAL_TABLET | Freq: Two times a day (BID) | ORAL | 1 refills | Status: DC

## 2019-01-04 NOTE — Telephone Encounter (Signed)
Requested Prescriptions   Pending Prescriptions Disp Refills  . primidone (MYSOLINE) 50 MG tablet 180 tablet 1    Sig: Take 1 tablet (50 mg total) by mouth 2 (two) times daily.   Rx last filled:07/15/18 #180 1 refills  Pt last seen: 07/15/18  Follow up appt scheduled: 03/07/19  sent

## 2019-03-03 NOTE — Progress Notes (Signed)
Subjective:   Shyasia Zermeno was seen in follow-up for essential tremor.  She was started on primidone last visit and worked to 50 mg twice per day.   Reports that it has helped but only about 20% better.  She still has tremor.  No SE with the medication.  Misses her grandchildren.  Been isolated with covid  No Known Allergies  Outpatient Encounter Medications as of 03/07/2019  Medication Sig  . Calcium Carb-Cholecalciferol (CALCIUM + D3) 600-200 MG-UNIT TABS Take one tablet two times daily.  . Cholecalciferol (VITAMIN D3) 2000 UNITS TABS Take 2,000 Units by mouth daily.  . cycloSPORINE (RESTASIS) 0.05 % ophthalmic emulsion Place 1 drop into both eyes 2 (two) times daily.  . Lactobacillus (PROBIOTIC ACIDOPHILUS PO) Take 1 tablet by mouth daily.  . meloxicam (MOBIC) 15 MG tablet TAKE 1 TABLET(15 MG) BY MOUTH DAILY AS NEEDED FOR PAIN  . Multiple Vitamin (MULTIVITAMIN) tablet Take 1 tablet by mouth daily.  Marland Kitchen omeprazole (PRILOSEC) 20 MG capsule TAKE 1 CAPSULE BY MOUTH EVERY DAY (Patient taking differently: Take 20 mg by mouth 2 (two) times daily before a meal. TAKE 1 CAPSULE BY MOUTH EVERY DAY)  . ondansetron (ZOFRAN) 4 MG tablet Take 1 tablet (4 mg total) by mouth every 8 (eight) hours as needed for nausea or vomiting.  . primidone (MYSOLINE) 50 MG tablet Take 1 tablet (50 mg total) by mouth 2 (two) times daily.  . propranolol (INDERAL) 60 MG tablet Take 1 tablet (60 mg total) by mouth 2 (two) times daily.  Marland Kitchen UNABLE TO FIND Move free joint health - 1 tablet daily   No facility-administered encounter medications on file as of 03/07/2019.     Past Medical History:  Diagnosis Date  . Arthritis   . Familial tremor   . GERD (gastroesophageal reflux disease)     Past Surgical History:  Procedure Laterality Date  . ABDOMINAL HYSTERECTOMY    . APPENDECTOMY    . BICEPS TENDON REPAIR    . BREAST BIOPSY Left 1975   neg-noscar seen  . BREAST SURGERY     breast biopsy, benign  . CATARACT  EXTRACTION, BILATERAL    . CERVICAL FUSION    . CHOLECYSTECTOMY    . COLONOSCOPY WITH PROPOFOL N/A 07/25/2018   Procedure: COLONOSCOPY WITH PROPOFOL;  Surgeon: Lin Landsman, MD;  Location: [facility] ENDOSCOPY;  Service: Gastroenterology;  Laterality: N/A;  . HAND TUMOR EXCISION Left 01/2018  . KNEE ARTHROSCOPY WITH DRILLING/MICROFRACTURE    . SHOULDER ARTHROSCOPY Right     Social History   Socioeconomic History  . Marital status: Widowed    Spouse name: Not on file  . Number of children: 2  . Years of education: Not on file  . Highest education level: Associate degree: academic program  Occupational History  . Occupation: retired    Comment: Solicitor  Social Needs  . Financial resource strain: Not on file  . Food insecurity    Worry: Not on file    Inability: Not on file  . Transportation needs    Medical: Not on file    Non-medical: Not on file  Tobacco Use  . Smoking status: Never Smoker  . Smokeless tobacco: Never Used  Substance and Sexual Activity  . Alcohol use: Yes    Alcohol/week: 0.0 standard drinks    Comment: occ  . Drug use: No  . Sexual activity: Not on file  Lifestyle  . Physical activity    Days per week: Not on  file    Minutes per session: Not on file  . Stress: Not on file  Relationships  . Social Herbalist on phone: Not on file    Gets together: Not on file    Attends religious service: Not on file    Active member of club or organization: Not on file    Attends meetings of clubs or organizations: Not on file    Relationship status: Not on file  . Intimate partner violence    Fear of current or ex partner: Not on file    Emotionally abused: Not on file    Physically abused: Not on file    Forced sexual activity: Not on file  Other Topics Concern  . Not on file  Social History Narrative   Widowed 08/30/2017- husband died of cancer on hospice    They had been married since 1988   Retired from [employer], Primary school teacher   From [place], [place]    Family Status  Relation Name Status  . Mother  Deceased  . Father  Deceased  . Mat Aunt  Deceased  . Sister 2 Alive  . Brother  Alive  . Child 2 Alive  . Neg Hx  (Not Specified)    Review of Systems Review of Systems  Constitutional: Negative.   HENT: Negative.   Eyes: Negative.   Respiratory: Negative.   Cardiovascular: Negative.   Gastrointestinal: Negative.   Skin: Negative.      Objective:   VITALS:   Vitals:   03/07/19 1028  BP: 140/72  Pulse: (!) 56  SpO2: 98%  Weight: 186 lb 6.4 oz (84.6 kg)  Height: 5\' 2"  (1.575 m)   Gen:  Appears stated age and in NAD. HEENT:  Normocephalic, atraumatic. The mucous membranes are moist. The superficial temporal arteries are without ropiness or tenderness. Cardiovascular: brady.  regular Lungs: Clear to auscultation bilaterally. Neck: There are no carotid bruits noted bilaterally.  NEUROLOGICAL:  Orientation:  The patient is alert and oriented x 3.   Cranial nerves: There is good facial symmetry.  Extraocular muscles are intact and visual fields are full to confrontational testing. Speech is fluent and clear. Soft palate rises symmetrically and there is no tongue deviation. Hearing is intact to conversational tone. Tone: Tone is good throughout. Sensation: Sensation is intact to light touch x 4 Coordination:  The patient has no dysdiadichokinesia or dysmetria. Motor: Strength is 5/5 in the bilateral upper and lower extremities.  Shoulder shrug is equal bilaterally.  There is no pronator drift.  There are no fasciculations noted. Gait and Station: The patient is able to ambulate without difficulty. The patient is able to heel toe walk without any difficulty. The patient is able to ambulate in a tandem fashion. The patient is able to stand in the Romberg position.   MOVEMENT EXAM: Tremor:  There is  tremor in the UE, noted most significantly with action.  The right is actually well controlled  today, but the left is not and is at least moderate.  She has head titubation when concentrating on other things.    Lab Results  Component Value Date   TSH 1.01 07/15/2018      Chemistry      Component Value Date/Time   NA 141 06/17/2018 0808   K 3.7 06/17/2018 0808   CL 104 06/17/2018 0808   CO2 28 06/17/2018 0808   BUN 13 06/17/2018 0808   CREATININE 0.78 06/17/2018 [id number]  Component Value Date/Time   CALCIUM 9.2 06/17/2018 0808   ALKPHOS 69 06/17/2018 0808   AST 16 06/17/2018 0808   AST 17 04/11/2013   ALT 14 06/17/2018 0808   BILITOT 0.5 06/17/2018 0808          Assessment/Plan:   1.  Essential Tremor.  -continue propranolol 60 mg bid  -increase primidone 50 mg - 2 in the AM, 1 at night for a week and the 2 tablets twice per day thereafter  -I talked to the patient about the logistics associated with DBS therapy.  I talked to the patient about risks/benefits/side effects of DBS therapy.  We talked about risks which included but were not limited to infection, paralysis, intraoperative seizure, death, stroke, bleeding around the electrode.   I talked to patient about fiducial placement 1 week prior to DBS therapy.  I talked to the patient about what to expect in the operating room, including the fact that this is an awake surgery.  We talked about battery placement as well as which is done under general anesthesia, generally approximately one week following the initial surgery.  We also talked about the fact that the patient will need to be off of medications for surgery.  The patient was given the opportunity to ask questions, which they did, and I answered them to the best of my ability today.  2.  F/u 5 months.   CC:  Tonia Ghent, MD

## 2019-03-07 ENCOUNTER — Encounter: Payer: Self-pay | Admitting: Neurology

## 2019-03-07 ENCOUNTER — Ambulatory Visit (INDEPENDENT_AMBULATORY_CARE_PROVIDER_SITE_OTHER): Payer: Medicare Other | Admitting: Neurology

## 2019-03-07 ENCOUNTER — Other Ambulatory Visit: Payer: Self-pay

## 2019-03-07 VITALS — BP 140/72 | HR 56 | Ht 62.0 in | Wt 186.4 lb

## 2019-03-07 DIAGNOSIS — G250 Essential tremor: Secondary | ICD-10-CM

## 2019-03-07 MED ORDER — PRIMIDONE 50 MG PO TABS: 100.0000 mg | ORAL_TABLET | Freq: Two times a day (BID) | ORAL | 1 refills | Status: DC

## 2019-03-07 NOTE — Patient Instructions (Signed)
Increase primidone 50 mg - 2 in the AM, 1 at night for a week and the 2 tablets twice per day thereafter

## 2019-05-03 ENCOUNTER — Other Ambulatory Visit: Payer: Self-pay | Admitting: Family Medicine

## 2019-05-03 DIAGNOSIS — Z1231 Encounter for screening mammogram for malignant neoplasm of breast: Secondary | ICD-10-CM

## 2019-06-13 ENCOUNTER — Other Ambulatory Visit: Payer: Self-pay | Admitting: Family Medicine

## 2019-06-19 ENCOUNTER — Other Ambulatory Visit: Payer: Self-pay

## 2019-06-19 ENCOUNTER — Ambulatory Visit
Admission: RE | Admit: 2019-06-19 | Discharge: 2019-06-19 | Disposition: A | Discharge status: Home / Self Care | Payer: Medicare PPO | Source: Ambulatory Visit | Attending: Family Medicine | Admitting: Family Medicine

## 2019-06-19 DIAGNOSIS — Z1231 Encounter for screening mammogram for malignant neoplasm of breast: Secondary | ICD-10-CM | POA: Diagnosis not present

## 2019-06-22 ENCOUNTER — Ambulatory Visit: Payer: Medicare Other

## 2019-06-22 ENCOUNTER — Other Ambulatory Visit: Payer: Self-pay | Admitting: Family Medicine

## 2019-06-22 ENCOUNTER — Other Ambulatory Visit: Payer: Self-pay

## 2019-06-22 ENCOUNTER — Ambulatory Visit (INDEPENDENT_AMBULATORY_CARE_PROVIDER_SITE_OTHER): Payer: Medicare PPO

## 2019-06-22 ENCOUNTER — Other Ambulatory Visit (INDEPENDENT_AMBULATORY_CARE_PROVIDER_SITE_OTHER): Payer: Medicare PPO

## 2019-06-22 DIAGNOSIS — G250 Essential tremor: Secondary | ICD-10-CM

## 2019-06-22 DIAGNOSIS — M899 Disorder of bone, unspecified: Secondary | ICD-10-CM | POA: Diagnosis not present

## 2019-06-22 DIAGNOSIS — M949 Disorder of cartilage, unspecified: Secondary | ICD-10-CM | POA: Diagnosis not present

## 2019-06-22 DIAGNOSIS — E785 Hyperlipidemia, unspecified: Secondary | ICD-10-CM

## 2019-06-22 DIAGNOSIS — Z0000 Encounter for general adult medical examination without abnormal findings: Secondary | ICD-10-CM

## 2019-06-22 LAB — LIPID PANEL
Cholesterol: 218 mg/dL — ABNORMAL HIGH (ref 0–200)
HDL: 67.6 mg/dL (ref >39.00)
LDL Cholesterol: 129 mg/dL — ABNORMAL HIGH (ref 0–99)
NonHDL: 150.04
Total CHOL/HDL Ratio: 3
Triglycerides: 105 mg/dL (ref 0.0–149.0)
VLDL: 21 mg/dL (ref 0.0–40.0)

## 2019-06-22 LAB — COMPREHENSIVE METABOLIC PANEL
ALT: 16 U/L (ref 0–35)
AST: 18 U/L (ref 0–37)
Albumin: 4.1 g/dL (ref 3.5–5.2)
Alkaline Phosphatase: 82 U/L (ref 39–117)
BUN: 16 mg/dL (ref 6–23)
CO2: 29 mEq/L (ref 19–32)
Calcium: 9.2 mg/dL (ref 8.4–10.5)
Chloride: 103 mEq/L (ref 96–112)
Creatinine, Ser: 0.72 mg/dL (ref 0.40–1.20)
GFR: 78.89 mL/min (ref >60.00)
Glucose, Bld: 91 mg/dL (ref 70–99)
Potassium: 3.6 mEq/L (ref 3.5–5.1)
Sodium: 141 mEq/L (ref 135–145)
Total Bilirubin: 0.4 mg/dL (ref 0.2–1.2)
Total Protein: 6.9 g/dL (ref 6.0–8.3)

## 2019-06-22 LAB — TSH: TSH: 2.05 u[IU]/mL (ref 0.35–4.50)

## 2019-06-22 LAB — VITAMIN D 25 HYDROXY (VIT D DEFICIENCY, FRACTURES): VITD: 37.26 ng/mL (ref 30.00–100.00)

## 2019-06-22 NOTE — Patient Instructions (Signed)
Katie Hale , Thank you for taking time to come for your Medicare Wellness Visit. I appreciate your ongoing commitment to your health goals. Please review the following plan we discussed and let me know if I can assist you in the future.   Screening recommendations/referrals: Colonoscopy: Up to date, completed 07/25/2018 Mammogram: Up to date, completed 06/19/2019 Bone Density: Up to date, completed 06/13/2015 Recommended yearly ophthalmology/optometry visit for glaucoma screening and checkup Recommended yearly dental visit for hygiene and checkup  Vaccinations: Influenza vaccine: Up to date, completed 02/02/2019 Pneumococcal vaccine: Completed series Tdap vaccine: decline Shingles vaccine: Completed series    Advanced directives: Please bring a copy of your POA (Power of Attorney) and/or Living Will to your next appointment.  Conditions/risks identified: none  Next appointment: 06/27/2019 @ 8:30 am    Preventive Care 65 Years and Older, Female Preventive care refers to lifestyle choices and visits with your health care provider that can promote health and wellness. What does preventive care include?  A yearly physical exam. This is also called an annual well check.  Dental exams once or twice a year.  Routine eye exams. Ask your health care provider how often you should have your eyes checked.  Personal lifestyle choices, including:  Daily care of your teeth and gums.  Regular physical activity.  Eating a healthy diet.  Avoiding tobacco and drug use.  Limiting alcohol use.  Practicing safe sex.  Taking low-dose aspirin every day.  Taking vitamin and mineral supplements as recommended by your health care provider. What happens during an annual well check? The services and screenings done by your health care provider during your annual well check will depend on your age, overall health, lifestyle risk factors, and family history of disease. Counseling  Your health care  provider may ask you questions about your:  Alcohol use.  Tobacco use.  Drug use.  Emotional well-being.  Home and relationship well-being.  Sexual activity.  Eating habits.  History of falls.  Memory and ability to understand (cognition).  Work and work Statistician.  Reproductive health. Screening  You may have the following tests or measurements:  Height, weight, and BMI.  Blood pressure.  Lipid and cholesterol levels. These may be checked every 5 years, or more frequently if you are over 47 years old.  Skin check.  Lung cancer screening. You may have this screening every year starting at age 67 if you have a 30-pack-year history of smoking and currently smoke or have quit within the past 15 years.  Fecal occult blood test (FOBT) of the stool. You may have this test every year starting at age 10.  Flexible sigmoidoscopy or colonoscopy. You may have a sigmoidoscopy every 5 years or a colonoscopy every 10 years starting at age 85.  Hepatitis C blood test.  Hepatitis B blood test.  Sexually transmitted disease (STD) testing.  Diabetes screening. This is done by checking your blood sugar (glucose) after you have not eaten for a while (fasting). You may have this done every 1-3 years.  Bone density scan. This is done to screen for osteoporosis. You may have this done starting at age 64.  Mammogram. This may be done every 1-2 years. Talk to your health care provider about how often you should have regular mammograms. Talk with your health care provider about your test results, treatment options, and if necessary, the need for more tests. Vaccines  Your health care provider may recommend certain vaccines, such as:  Influenza vaccine. This is  recommended every year.  Tetanus, diphtheria, and acellular pertussis (Tdap, Td) vaccine. You may need a Td booster every 10 years.  Zoster vaccine. You may need this after age 54.  Pneumococcal 13-valent conjugate (PCV13)  vaccine. One dose is recommended after age 87.  Pneumococcal polysaccharide (PPSV23) vaccine. One dose is recommended after age 61. Talk to your health care provider about which screenings and vaccines you need and how often you need them. This information is not intended to replace advice given to you by your health care provider. Make sure you discuss any questions you have with your health care provider. Document Released: 06/21/2015 Document Revised: 02/12/2016 Document Reviewed: 03/26/2015 Elsevier Interactive Patient Education  2017 [place] Prevention in the Home Falls can cause injuries. They can happen to people of all ages. There are many things you can do to make your home safe and to help prevent falls. What can I do on the outside of my home?  Regularly fix the edges of walkways and driveways and fix any cracks.  Remove anything that might make you trip as you walk through a door, such as a raised step or threshold.  Trim any bushes or trees on the path to your home.  Use bright outdoor lighting.  Clear any walking paths of anything that might make someone trip, such as rocks or tools.  Regularly check to see if handrails are loose or broken. Make sure that both sides of any steps have handrails.  Any raised decks and porches should have guardrails on the edges.  Have any leaves, snow, or ice cleared regularly.  Use sand or salt on walking paths during winter.  Clean up any spills in your garage right away. This includes oil or grease spills. What can I do in the bathroom?  Use night lights.  Install grab bars by the toilet and in the tub and shower. Do not use towel bars as grab bars.  Use non-skid mats or decals in the tub or shower.  If you need to sit down in the shower, use a plastic, non-slip stool.  Keep the floor dry. Clean up any water that spills on the floor as soon as it happens.  Remove soap buildup in the tub or shower  regularly.  Attach bath mats securely with double-sided non-slip rug tape.  Do not have throw rugs and other things on the floor that can make you trip. What can I do in the bedroom?  Use night lights.  Make sure that you have a light by your bed that is easy to reach.  Do not use any sheets or blankets that are too big for your bed. They should not hang down onto the floor.  Have a firm chair that has side arms. You can use this for support while you get dressed.  Do not have throw rugs and other things on the floor that can make you trip. What can I do in the kitchen?  Clean up any spills right away.  Avoid walking on wet floors.  Keep items that you use a lot in easy-to-reach places.  If you need to reach something above you, use a strong step stool that has a grab bar.  Keep electrical cords out of the way.  Do not use floor polish or wax that makes floors slippery. If you must use wax, use non-skid floor wax.  Do not have throw rugs and other things on the floor that can make you trip.  What can I do with my stairs?  Do not leave any items on the stairs.  Make sure that there are handrails on both sides of the stairs and use them. Fix handrails that are broken or loose. Make sure that handrails are as long as the stairways.  Check any carpeting to make sure that it is firmly attached to the stairs. Fix any carpet that is loose or worn.  Avoid having throw rugs at the top or bottom of the stairs. If you do have throw rugs, attach them to the floor with carpet tape.  Make sure that you have a light switch at the top of the stairs and the bottom of the stairs. If you do not have them, ask someone to add them for you. What else can I do to help prevent falls?  Wear shoes that:  Do not have high heels.  Have rubber bottoms.  Are comfortable and fit you well.  Are closed at the toe. Do not wear sandals.  If you use a stepladder:  Make sure that it is fully  opened. Do not climb a closed stepladder.  Make sure that both sides of the stepladder are locked into place.  Ask someone to hold it for you, if possible.  Clearly mark and make sure that you can see:  Any grab bars or handrails.  First and last steps.  Where the edge of each step is.  Use tools that help you move around (mobility aids) if they are needed. These include:  Canes.  Walkers.  Scooters.  Crutches.  Turn on the lights when you go into a dark area. Replace any light bulbs as soon as they burn out.  Set up your furniture so you have a clear path. Avoid moving your furniture around.  If any of your floors are uneven, fix them.  If there are any pets around you, be aware of where they are.  Review your medicines with your doctor. Some medicines can make you feel dizzy. This can increase your chance of falling. Ask your doctor what other things that you can do to help prevent falls. This information is not intended to replace advice given to you by your health care provider. Make sure you discuss any questions you have with your health care provider. Document Released: 03/21/2009 Document Revised: 10/31/2015 Document Reviewed: 06/29/2014 Elsevier Interactive Patient Education  2017 [employer].

## 2019-06-22 NOTE — Progress Notes (Signed)
PCP notes:  Health Maintenance: No gaps noted   Abnormal Screenings: none   Patient concerns: Patient complains of heart flutters and a burning mouth.    Nurse concerns: none   Next PCP appt.: 06/27/2019 @ 8:30 am

## 2019-06-22 NOTE — Progress Notes (Signed)
Subjective:   Katie Hale is a 74 y.o. female who presents for Medicare Annual (Subsequent) preventive examination.  Review of Systems: N/A   This visit is being conducted through telemedicine via telephone at the nurse health advisor's home address due to the COVID-19 pandemic. This patient has given me verbal consent via doximity to conduct this visit, patient states they are participating from their home address. Patient and myself are on the telephone call. There is no referral for this visit. Some vital signs may be absent or patient reported.    Patient identification: identified by name, DOB, and current address   Cardiac Risk Factors include: advanced age (>60men, >36 women)     Objective:     Vitals: There were no vitals taken for this visit.  There is no height or weight on file to calculate BMI.  Advanced Directives 06/22/2019 03/07/2019 07/25/2018 06/17/2018  Does Patient Have a Medical Advance Directive? Yes No;Yes Yes Yes  Type of Paramedic of [place];Living will Living will;Healthcare Power of [place];Living will [place];Living will  Copy of [place] in Chart? No - copy requested - No - copy requested No - copy requested    Tobacco Social History   Tobacco Use  Smoking Status Never Smoker  Smokeless Tobacco Never Used     Counseling given: Not Answered   Clinical Intake:  Pre-visit preparation completed: Yes  Pain : No/denies pain     Nutritional Risks: None Diabetes: No  How often do you need to have someone help you when you read instructions, pamphlets, or other written materials from your doctor or pharmacy?: 1 - Never What is the last grade level you completed in school?: associates  Interpreter Needed?: No  Information entered by :: CJohnson, LPN  Past Medical History:  Diagnosis Date  . Arthritis   . Familial tremor   . GERD (gastroesophageal  reflux disease)    Past Surgical History:  Procedure Laterality Date  . ABDOMINAL HYSTERECTOMY    . APPENDECTOMY    . BICEPS TENDON REPAIR    . BREAST BIOPSY Left 1975   neg-noscar seen  . BREAST SURGERY     breast biopsy, benign  . CATARACT EXTRACTION, BILATERAL    . CERVICAL FUSION    . CHOLECYSTECTOMY    . COLONOSCOPY WITH PROPOFOL N/A 07/25/2018   Procedure: COLONOSCOPY WITH PROPOFOL;  Surgeon: Lin Landsman, MD;  Location: [facility] ENDOSCOPY;  Service: Gastroenterology;  Laterality: N/A;  . HAND TUMOR EXCISION Left 01/2018  . KNEE ARTHROSCOPY WITH DRILLING/MICROFRACTURE    . SHOULDER ARTHROSCOPY Right    Family History  Problem Relation Age of Onset  . Tremor Mother   . Breast cancer Mother 37  . Diabetes Father   . Tremor Father   . Breast cancer Maternal Aunt 67  . Tremor Sister   . Breast cancer Sister   . Tremor Brother   . Healthy Child   . Colon cancer Neg Hx    Social History   Socioeconomic History  . Marital status: Widowed    Spouse name: Not on file  . Number of children: 2  . Years of education: Not on file  . Highest education level: Associate degree: academic program  Occupational History  . Occupation: retired    Comment: Solicitor  Tobacco Use  . Smoking status: Never Smoker  . Smokeless tobacco: Never Used  Substance and Sexual Activity  . Alcohol use:  Yes    Alcohol/week: 0.0 standard drinks    Comment: occ  . Drug use: No  . Sexual activity: Not on file  Other Topics Concern  . Not on file  Social History Narrative   Widowed August 17, 2017- husband died of cancer on hospice    They had been married since 1988   Retired from [employer], Management consultant   From [place], [place]   Social Determinants of Health   Financial Resource Strain: [place]   . Difficulty of Paying Living Expenses: Not hard at all  Food Insecurity: No Food Insecurity  . Worried About Charity fundraiser in the Last Year: Never true  . Ran Out of Food in  the Last Year: Never true  Transportation Needs: No Transportation Needs  . Lack of Transportation (Medical): No  . Lack of Transportation (Non-Medical): No  Physical Activity: Inactive  . Days of Exercise per Week: 0 days  . Minutes of Exercise per Session: 0 min  Stress: No Stress Concern Present  . Feeling of Stress : Not at all  Social Connections:   . Frequency of Communication with Friends and Family: Not on file  . Frequency of Social Gatherings with Friends and Family: Not on file  . Attends Religious Services: Not on file  . Active Member of Clubs or Organizations: Not on file  . Attends Archivist Meetings: Not on file  . Marital Status: Not on file    Outpatient Encounter Medications as of 06/22/2019  Medication Sig  . Calcium Carb-Cholecalciferol (CALCIUM + D3) 600-200 MG-UNIT TABS Take one tablet two times daily.  . Cholecalciferol (VITAMIN D3) 2000 UNITS TABS Take 2,000 Units by mouth daily.  . cycloSPORINE (RESTASIS) 0.05 % ophthalmic emulsion Place 1 drop into both eyes 2 (two) times daily.  . Lactobacillus (PROBIOTIC ACIDOPHILUS PO) Take 1 tablet by mouth daily.  . meloxicam (MOBIC) 15 MG tablet TAKE 1 TABLET(15 MG) BY MOUTH DAILY AS NEEDED FOR PAIN  . Multiple Vitamin (MULTIVITAMIN) tablet Take 1 tablet by mouth daily.  Marland Kitchen omeprazole (PRILOSEC) 20 MG capsule TAKE 1 CAPSULE BY MOUTH EVERY DAY  . ondansetron (ZOFRAN) 4 MG tablet Take 1 tablet (4 mg total) by mouth every 8 (eight) hours as needed for nausea or vomiting.  . primidone (MYSOLINE) 50 MG tablet Take 2 tablets (100 mg total) by mouth 2 (two) times daily.  . propranolol (INDERAL) 60 MG tablet TAKE 1 TABLET(60 MG) BY MOUTH TWICE DAILY  . UNABLE TO FIND Move free joint health - 1 tablet daily   No facility-administered encounter medications on file as of 06/22/2019.    Activities of Daily Living In your present state of health, do you have any difficulty performing the following activities: 06/22/2019    Hearing? N  Vision? N  Difficulty concentrating or making decisions? N  Walking or climbing stairs? N  Dressing or bathing? N  Doing errands, shopping? N  Preparing Food and eating ? N  Using the Toilet? N  In the past six months, have you accidently leaked urine? N  Do you have problems with loss of bowel control? N  Managing your Medications? N  Managing your Finances? N  Housekeeping or managing your Housekeeping? N  Some recent data might be hidden    Patient Care Team: Tonia Ghent, MD as PCP - General (Family Medicine) Tat, Eustace Quail, DO as Consulting Physician (Neurology)    Assessment:   This is a routine wellness examination  for [employer].  Exercise Activities and Dietary recommendations Current Exercise Habits: The patient does not participate in regular exercise at present, Exercise limited by: None identified  Goals    . Increase physical activity     Starting 06/17/2018, I will continue to walk 30-45 minutes 4 days per week.     . Patient Stated     06/22/2019, I will maintain and continue medications as prescribed.        Fall Risk Fall Risk  06/22/2019 03/07/2019 07/15/2018 06/17/2018 06/07/2017  Falls in the past year? 0 0 0 0 No  Number falls in past yr: 0 0 0 - -  Injury with Fall? 0 0 0 - -  Risk for fall due to : Medication side effect - - - -  Follow up Falls evaluation completed;Falls prevention discussed - Falls evaluation completed - -   Is the patient's home free of loose throw rugs in walkways, pet beds, electrical cords, etc?   yes      Grab bars in the bathroom? yes      Handrails on the stairs?   yes      Adequate lighting?   yes  Timed Get Up and Go performed: N/A  Depression Screen PHQ 2/9 Scores 06/22/2019 06/17/2018 06/07/2017  PHQ - 2 Score 0 2 0  PHQ- 9 Score 0 4 -     Cognitive Function MMSE - Mini Mental State Exam 06/22/2019 06/17/2018  Orientation to time 5 5  Orientation to Place 5 5  Registration 3 3  Attention/  Calculation 5 0  Recall 3 3  Language- name 2 objects - 0  Language- repeat 1 1  Language- follow 3 step command - 3  Language- read & follow direction - 0  Write a sentence - 0  Copy design - 0  Total score - 20  Mini Cog  Mini-Cog screen was completed. Maximum score is 22. A value of 0 denotes this part of the MMSE was not completed or the patient failed this part of the Mini-Cog screening.       Immunization History  Administered Date(s) Administered  . Influenza, High Dose Seasonal PF 03/08/2016, 02/22/2018, 02/02/2019  . Influenza-Unspecified 03/08/2014, 03/09/2015, 04/04/2017, 02/22/2018  . Moderna SARS-COVID-2 Vaccination 06/20/2019  . Pneumococcal Conjugate-13 05/22/2015  . Pneumococcal Polysaccharide-23 06/09/2015  . Td 06/08/2006  . Zoster 06/08/2002  . Zoster Recombinat (Shingrix) 12/22/2017, 02/22/2018    Qualifies for Shingles Vaccine?Completed series  Screening Tests Health Maintenance  Topic Date Due  . TETANUS/TDAP  06/06/2020 (Originally 06/08/2016)  . COLONOSCOPY  07/26/2023  . INFLUENZA VACCINE  Completed  . DEXA SCAN  Completed  . Hepatitis C Screening  Completed  . PNA vac Low Risk Adult  Completed    Cancer Screenings: Lung: Low Dose CT Chest recommended if Age 74-80 years, 30 pack-year currently smoking OR have quit w/in 15years. Patient does not qualify. Breast:  Up to date on Mammogram? Yes, completed 06/19/2019   Up to date of Bone Density/Dexa? Yes, completed 06/13/2015 Colorectal: completed 07/25/2018  Additional Screenings:  Hepatitis C Screening: 05/15/2016     Plan:   Patient will maintain and continue medications as prescribed.  I have personally reviewed and noted the following in the patient's chart:   . Medical and social history . Use of alcohol, tobacco or illicit drugs  . Current medications and supplements . Functional ability and status . Nutritional status . Physical activity . Advanced directives . List of other  physicians .  Hospitalizations, surgeries, and ER visits in previous 12 months . Vitals . Screenings to include cognitive, depression, and falls . Referrals and appointments  In addition, I have reviewed and discussed with patient certain preventive protocols, quality metrics, and best practice recommendations. A written personalized care plan for preventive services as well as general preventive health recommendations were provided to patient.     Andrez Grime, LPN  [id number]

## 2019-06-27 ENCOUNTER — Other Ambulatory Visit: Payer: Self-pay

## 2019-06-27 ENCOUNTER — Encounter: Payer: Self-pay | Admitting: Family Medicine

## 2019-06-27 ENCOUNTER — Ambulatory Visit (INDEPENDENT_AMBULATORY_CARE_PROVIDER_SITE_OTHER): Payer: Medicare PPO | Admitting: Family Medicine

## 2019-06-27 VITALS — BP 126/76 | HR 62 | Temp 97.2°F | Ht 62.0 in | Wt 186.6 lb

## 2019-06-27 DIAGNOSIS — J3489 Other specified disorders of nose and nasal sinuses: Secondary | ICD-10-CM

## 2019-06-27 DIAGNOSIS — K219 Gastro-esophageal reflux disease without esophagitis: Secondary | ICD-10-CM

## 2019-06-27 DIAGNOSIS — L309 Dermatitis, unspecified: Secondary | ICD-10-CM

## 2019-06-27 DIAGNOSIS — G250 Essential tremor: Secondary | ICD-10-CM

## 2019-06-27 DIAGNOSIS — Z0000 Encounter for general adult medical examination without abnormal findings: Secondary | ICD-10-CM

## 2019-06-27 DIAGNOSIS — M1990 Unspecified osteoarthritis, unspecified site: Secondary | ICD-10-CM

## 2019-06-27 DIAGNOSIS — R42 Dizziness and giddiness: Secondary | ICD-10-CM

## 2019-06-27 DIAGNOSIS — Z7189 Other specified counseling: Secondary | ICD-10-CM

## 2019-06-27 DIAGNOSIS — I498 Other specified cardiac arrhythmias: Secondary | ICD-10-CM | POA: Diagnosis not present

## 2019-06-27 MED ORDER — TRIAMCINOLONE ACETONIDE 0.1 % EX CREA: 1.0000 "application " | TOPICAL_CREAM | Freq: Two times a day (BID) | CUTANEOUS | 3 refills | Status: DC | PRN

## 2019-06-27 MED ORDER — MELOXICAM 15 MG PO TABS: ORAL_TABLET | ORAL | 3 refills | Status: DC

## 2019-06-27 MED ORDER — ONDANSETRON HCL 4 MG PO TABS: 4.0000 mg | ORAL_TABLET | Freq: Three times a day (TID) | ORAL | 2 refills | Status: DC | PRN

## 2019-06-27 MED ORDER — FLUTICASONE PROPIONATE 50 MCG/ACT NA SUSP: 1.0000 | Freq: Every day | NASAL | 12 refills | Status: DC

## 2019-06-27 NOTE — Progress Notes (Signed)
This visit occurred during the SARS-CoV-2 public health emergency.  Safety protocols were in place, including screening questions prior to the visit, additional usage of staff PPE, and extensive cleaning of exam room while observing appropriate contact time as indicated for disinfecting solutions.  She had her 1st covid vaccine with f/u vaccine pending.    Flu prev done. Shingles prev done. PNA 2017. Tetanus 2008, d/w pt. Colonoscopy 2020. Breast cancer screening 2021 DXA 2017. Okay to defer for now, d/w pt.   Advance directive- daughter designated if patient were incapacitated.  Labs d/w pt.  flonase helped, no ADE on med.  Sinus pressure resolved in the meantime.    She has rare vertigo on standing.  She has nausea with that.  Has used zofran.  No syncope.  Sx with head turning, not with eye tracking.  Longstanding per patient report.  Discussed bedside exercise for vertigo.   She has h/o eczema.  OTC creams don't help.    We talked about grief and her loss of her husband.  She has been isolated more due to covid.    Tremor.  Still on propranolol and primidone at baseline, without a lot of improvement.  She isn't worse.  Variable.  Has seen Dr. Carles Collet with neuro.  She has f/u pending.    She has noted possible heart fluttering, brief.  She was told in the past that she had mild mitral valve prolapse.  Still on BB at baseline.  No CP, not SOB.    She had noted occ burning in the mouth. Intermittent.  Has used Duke's magic mouthwash.  Noted burning on the back of the tongue and roof of mouth.    She is taking meloxicam less now.  She had injections in her back and that helped a lot.    PMH and SH reviewed  ROS: Per HPI unless specifically indicated in ROS section   Meds, vitals, and allergies reviewed.   GEN: nad, alert and oriented HEENT: ncat, OP wnl NECK: supple w/o LA CV: rrr. PULM: ctab, no inc wob ABD: soft, +bs EXT: no edema SKIN: no acute rash Faint tremor noted at  baseline.   EKG with Sinus Bradycardia with possible LVH.  D/w pt at OV.

## 2019-06-27 NOTE — Patient Instructions (Addendum)
If you have more mouth burning, then start taking omeprazole twice a day and update me.   Use the bedside exercise for vertigo and update me as needed.  Try triamcinolone for eczema as needed.  Take care.  Glad to see you.  We'll call about seeing Dr. Rockey Situ.

## 2019-06-29 DIAGNOSIS — R42 Dizziness and giddiness: Secondary | ICD-10-CM | POA: Insufficient documentation

## 2019-06-29 DIAGNOSIS — L309 Dermatitis, unspecified: Secondary | ICD-10-CM | POA: Insufficient documentation

## 2019-06-29 DIAGNOSIS — J3489 Other specified disorders of nose and nasal sinuses: Secondary | ICD-10-CM | POA: Insufficient documentation

## 2019-06-29 DIAGNOSIS — I498 Other specified cardiac arrhythmias: Secondary | ICD-10-CM | POA: Insufficient documentation

## 2019-06-29 NOTE — Assessment & Plan Note (Signed)
She had her 1st covid vaccine with f/u vaccine pending.    Flu prev done. Shingles prev done. PNA 2017. Tetanus 2008, d/w pt. Colonoscopy 2020. Breast cancer screening 2021 DXA 2017. Okay to defer for now, d/w pt.   Advance directive- daughter designated if patient were incapacitated.  Labs d/w pt.

## 2019-06-29 NOTE — Assessment & Plan Note (Signed)
She had noted occ burning in the mouth. Intermittent.  Has used Duke's magic mouthwash.  Noted burning on the back of the tongue and roof of mouth.  Could be from occult GERD.  Can start taking omeprazole twice a day and update me as needed.  She agrees.

## 2019-06-29 NOTE — Assessment & Plan Note (Signed)
She has rare vertigo on standing.  She has nausea with that.  Has used zofran.  No syncope.  Sx with head turning, not with eye tracking.  Longstanding per patient report.  Discussed bedside exercise for vertigo.

## 2019-06-29 NOTE — Assessment & Plan Note (Signed)
Advance directive- daughter designated if patient were incapacitated.   

## 2019-06-29 NOTE — Assessment & Plan Note (Signed)
flonase helped, no ADE on med.  Sinus pressure resolved in the meantime.  Continue as is.

## 2019-06-29 NOTE — Assessment & Plan Note (Signed)
Okay to try TAC cream prn and update me as needed.  She agrees.

## 2019-06-29 NOTE — Assessment & Plan Note (Signed)
Still on propranolol and primidone at baseline, without a lot of improvement.  She isn't worse.  Variable.  Has seen Dr. Carles Collet with neuro.  She has f/u pending.  I'll defer, patient agrees.

## 2019-06-29 NOTE — Assessment & Plan Note (Signed)
She has noted possible heart fluttering, brief.  She was told in the past that she had mild mitral valve prolapse.  Still on BB at baseline.  No CP, not SOB.  Would continue as is.  EKG with Sinus Bradycardia with possible LVH. Refer to cards.  Still okay for outpatient f/u.  She may need longer ambulatory monitoring.

## 2019-06-29 NOTE — Assessment & Plan Note (Signed)
She is taking meloxicam less now.  She had injections in her back and that helped a lot.

## 2019-07-11 ENCOUNTER — Ambulatory Visit (INDEPENDENT_AMBULATORY_CARE_PROVIDER_SITE_OTHER): Payer: Medicare PPO | Admitting: Cardiovascular Disease

## 2019-07-11 ENCOUNTER — Encounter: Payer: Self-pay | Admitting: Cardiovascular Disease

## 2019-07-11 ENCOUNTER — Other Ambulatory Visit: Payer: Self-pay

## 2019-07-11 VITALS — BP 130/82 | HR 64 | Ht 62.5 in | Wt 191.5 lb

## 2019-07-11 DIAGNOSIS — I498 Other specified cardiac arrhythmias: Secondary | ICD-10-CM | POA: Diagnosis not present

## 2019-07-11 DIAGNOSIS — R42 Dizziness and giddiness: Secondary | ICD-10-CM | POA: Diagnosis not present

## 2019-07-11 DIAGNOSIS — I341 Nonrheumatic mitral (valve) prolapse: Secondary | ICD-10-CM

## 2019-07-11 DIAGNOSIS — G250 Essential tremor: Secondary | ICD-10-CM | POA: Diagnosis not present

## 2019-07-11 NOTE — Patient Instructions (Addendum)
Information on CT coronary calcium score  Please call if palpitations get worse, we could order a zio patch  Echo can be performed if symptoms get worse  Medication Instructions:  No changes  If you need a refill on your cardiac medications before your next appointment, please call your pharmacy.    Lab work: No new labs needed   If you have labs (blood work) drawn today and your tests are completely normal, you will receive your results only by: Marland Kitchen MyChart Message (if you have MyChart) OR . A paper copy in the mail If you have any lab test that is abnormal or we need to change your treatment, we will call you to review the results.   Testing/Procedures: No new testing needed   Follow-Up: At [facility], you and your health needs are our priority.  As part of our continuing mission to provide you with exceptional heart care, we have created designated Provider Care Teams.  These Care Teams include your primary Cardiologist (physician) and Advanced Practice Providers (APPs -  Physician Assistants and Nurse Practitioners) who all work together to provide you with the care you need, when you need it.  . You will need a follow up appointment: as needed  . Providers on your designated Care Team:   . Murray Hodgkins, NP . Christell Faith, PA-C . Marrianne Mood, PA-C  Any Other Special Instructions Will Be Listed Below (If Applicable).  For educational health videos Log in to : [email or web address] Or : [email or web address], password : [email or web address]

## 2019-07-11 NOTE — Progress Notes (Signed)
Cardiology Office Note  Date:  07/11/2019   ID:  Katie Hale, Katie Hale Dec 12, 1945, MRN [id number]  PCP:  Tonia Ghent, MD   Chief Complaint  Patient presents with  . office visit    Pt c/o fluttering in chest. joint pain. Meds verbally reviewed w/ pt.    HPI:  [name] is a 74 year old woman with past medical history of Mitral valve prolapse, "dx 30 yrs ago" No diabetes No smoker Who presents by referral from Dr. Damita Dunnings for evaluation of her palpitations  Lost husband, liver cancer, jan2019 Difficulty adjusting over the past 2 years  She reports recently noting sporadic palpitations They are rare, typically 1 or 2 palpitations at the time, Not long clusters of arrhythmia First noticed symptoms December 2020 Notices them more in the evenings not during the daytime when she is active When she is reading a book or watching TV  Lives at [place] at baseline  Family in [place] Has not seen them recently secondary to Covid  Had 1st vaccine shot  No recent echo to look at mitral valve  rare vertigo on standing.   Chronic tremor on propranolol .  Controlled symptoms  EKG personally reviewed by myself on todays visit Shows normal sinus rhythm rate 64 bpm no significant ST-T wave changes   PMH:   has a past medical history of Arthritis, Familial tremor, and GERD (gastroesophageal reflux disease).  PSH:    Past Surgical History:  Procedure Laterality Date  . ABDOMINAL HYSTERECTOMY    . APPENDECTOMY    . BICEPS TENDON REPAIR    . BREAST BIOPSY Left 1975   neg-noscar seen  . BREAST SURGERY     breast biopsy, benign  . CATARACT EXTRACTION, BILATERAL    . CERVICAL FUSION    . CHOLECYSTECTOMY    . COLONOSCOPY WITH PROPOFOL N/A 07/25/2018   Procedure: COLONOSCOPY WITH PROPOFOL;  Surgeon: Lin Landsman, MD;  Location[facility] ENDOSCOPY;  Service: Gastroenterology;  Laterality: N/A;  . HAND TUMOR EXCISION Left 01/2018  . KNEE ARTHROSCOPY WITH  DRILLING/MICROFRACTURE    . SHOULDER ARTHROSCOPY Right     Current Outpatient Medications  Medication Sig Dispense Refill  . Calcium Carb-Cholecalciferol (CALCIUM + D3) 600-200 MG-UNIT TABS Take one tablet two times daily.    . Cholecalciferol (VITAMIN D3) 2000 UNITS TABS Take 2,000 Units by mouth daily.    . cycloSPORINE (RESTASIS) 0.05 % ophthalmic emulsion Place 1 drop into both eyes 2 (two) times daily.    . fluticasone (FLONASE) 50 MCG/ACT nasal spray Place 1 spray into both nostrils daily. 16 g 12  . Lactobacillus (PROBIOTIC ACIDOPHILUS PO) Take 1 tablet by mouth daily.    . meloxicam (MOBIC) 15 MG tablet TAKE 1 TABLET(15 MG) BY MOUTH DAILY AS NEEDED FOR PAIN 90 tablet 3  . Multiple Vitamin (MULTIVITAMIN) tablet Take 1 tablet by mouth daily.    Marland Kitchen omeprazole (PRILOSEC) 20 MG capsule TAKE 1 CAPSULE BY MOUTH EVERY DAY 90 capsule 3  . ondansetron (ZOFRAN) 4 MG tablet Take 1 tablet (4 mg total) by mouth every 8 (eight) hours as needed for nausea or vomiting. 30 tablet 2  . primidone (MYSOLINE) 50 MG tablet Take 2 tablets (100 mg total) by mouth 2 (two) times daily. 360 tablet 1  . propranolol (INDERAL) 60 MG tablet TAKE 1 TABLET(60 MG) BY MOUTH TWICE DAILY 180 tablet 3  . triamcinolone cream (KENALOG) 0.1 % Apply 1 application topically 2 (two) times daily as needed. [place]  g 3  . UNABLE TO FIND Move free joint health - 1 tablet daily     No current facility-administered medications for this visit.     Allergies:   Patient has no known allergies.   Social History:  The patient  reports that she has never smoked. She has never used smokeless tobacco. She reports current alcohol use. She reports that she does not use drugs.   Family History:   family history includes Breast cancer in her sister; Breast cancer (age of onset: 66) in her maternal aunt; Breast cancer (age of onset: 70) in her mother; Diabetes in her father; Healthy in her child; Tremor in her brother, father, mother, and sister.     Review of Systems: Review of Systems  Constitutional: Negative.   HENT: Negative.   Respiratory: Negative.   Cardiovascular: Positive for palpitations.  Gastrointestinal: Negative.   Musculoskeletal: Negative.   Neurological: Negative.   Psychiatric/Behavioral: Negative.   All other systems reviewed and are negative.   PHYSICAL EXAM: VS:  BP 130/82 (BP Location: Left Arm, Patient Position: Sitting, Cuff Size: Large)   Pulse 64   Ht 5' 2.5" (1.588 m)   Wt 191 lb 8 oz (86.9 kg)   SpO2 96%   BMI 34.47 kg/m  , BMI Body mass index is 34.47 kg/m. GEN: Well nourished, well developed, in no acute distress HEENT: normal Neck: no JVD, carotid bruits, or masses Cardiac: RRR; no murmurs, rubs, or gallops,no edema  Respiratory:  clear to auscultation bilaterally, normal work of breathing GI: soft, nontender, nondistended, + BS MS: no deformity or atrophy Skin: warm and dry, no rash Neuro:  Strength and sensation are intact Psych: euthymic mood, full affect  Recent Labs: 06/22/2019: ALT 16; BUN 16; Creatinine, Ser 0.72; Potassium 3.6; Sodium 141; TSH 2.05    Lipid Panel Lab Results  Component Value Date   CHOL 218 (H) 06/22/2019   HDL 67.60 06/22/2019   LDLCALC 129 (H) 06/22/2019   TRIG 105.0 06/22/2019      Wt Readings from Last 3 Encounters:  07/11/19 191 lb 8 oz (86.9 kg)  06/27/19 186 lb 9.6 oz (84.6 kg)  03/07/19 186 lb 6.4 oz (84.6 kg)      ASSESSMENT AND PLAN:  Problem List Items Addressed This Visit    Familial tremor   Fluttering heart - Primary   Relevant Orders   EKG 12-Lead   Vertigo    Other Visit Diagnoses    Mitral valve prolapse         Palpitations Likely having PACs or PVCs Less likely atrial fibrillation given isolated beats with no long periods of arrhythmia Discussed various types of arrhythmia with her in detail Recommended if symptoms get worse we will order a ZIO monitor -She is happy to continue her propranolol for now and if  symptoms get worse she will call us  Tremor On propranolol  Mitral valve prolapse No significant murmur appreciated on exam Discussed performing echocardiogram for any worsening shortness of breath fluid retention or palpitations  Disposition:   F/U as needed   Total encounter time more than 45 minutes  Greater than 50% was spent in counseling and coordination of care with the patient    Signed, Esmond Plants, M.D., Ph.D. [place], [place]

## 2019-08-01 ENCOUNTER — Ambulatory Visit (INDEPENDENT_AMBULATORY_CARE_PROVIDER_SITE_OTHER): Payer: Medicare PPO | Admitting: Neurology

## 2019-08-01 ENCOUNTER — Encounter: Payer: Self-pay | Admitting: Neurology

## 2019-08-01 ENCOUNTER — Other Ambulatory Visit: Payer: Self-pay

## 2019-08-01 VITALS — BP 137/76 | HR 71 | Ht 62.5 in | Wt 190.0 lb

## 2019-08-01 DIAGNOSIS — G250 Essential tremor: Secondary | ICD-10-CM | POA: Diagnosis not present

## 2019-08-01 MED ORDER — PRIMIDONE 50 MG PO TABS: ORAL_TABLET | ORAL | 1 refills | Status: DC

## 2019-08-01 NOTE — Progress Notes (Signed)
Katie Hale was seen today in follow up for essential tremor.  My previous records were reviewed prior to todays visit, including PCP and cardiology records.  Last visit, we increased the patient's primidone, so that she is now taking 2 tablets twice per day.  Pt thinks that she is about the same.  Doesn't think that med been that helpful, including propranolol.  L hand is worse (she is R handed).  Harder to work in [employer].   Now writing with a fatter pen.   Pt denies falls.  Pt denies lightheadedness, near syncope.  No hallucinations.  Mood has been good.  Current prescribed movement disorder medications: primidone, 100 big Propranolol, 60 bid  ALLERGIES:  No Known Allergies  CURRENT MEDICATIONS:  Outpatient Encounter Medications as of 08/01/2019  Medication Sig  . Calcium Carb-Cholecalciferol (CALCIUM + D3) 600-200 MG-UNIT TABS Take one tablet two times daily.  . Cholecalciferol (VITAMIN D3) 2000 UNITS TABS Take 2,000 Units by mouth daily.  . cycloSPORINE (RESTASIS) 0.05 % ophthalmic emulsion Place 1 drop into both eyes 2 (two) times daily.  . fluticasone (FLONASE) 50 MCG/ACT nasal spray Place 1 spray into both nostrils daily.  . Lactobacillus (PROBIOTIC ACIDOPHILUS PO) Take 1 tablet by mouth daily.  . meloxicam (MOBIC) 15 MG tablet TAKE 1 TABLET(15 MG) BY MOUTH DAILY AS NEEDED FOR PAIN  . Multiple Vitamin (MULTIVITAMIN) tablet Take 1 tablet by mouth daily.  Marland Kitchen omeprazole (PRILOSEC) 20 MG capsule TAKE 1 CAPSULE BY MOUTH EVERY DAY  . ondansetron (ZOFRAN) 4 MG tablet Take 1 tablet (4 mg total) by mouth every 8 (eight) hours as needed for nausea or vomiting.  . primidone (MYSOLINE) 50 MG tablet Take 2 tablets (100 mg total) by mouth 2 (two) times daily.  . propranolol (INDERAL) 60 MG tablet TAKE 1 TABLET(60 MG) BY MOUTH TWICE DAILY  . triamcinolone cream (KENALOG) 0.1 % Apply 1 application topically 2 (two) times daily as needed.  Marland Kitchen UNABLE TO FIND Move free joint health - 1 tablet  daily   No facility-administered encounter medications on file as of 08/01/2019.    PHYSICAL EXAMINATION:    VITALS:   Vitals:   08/01/19 0806  BP: 137/76  Pulse: 71  SpO2: 94%  Weight: 190 lb (86.2 kg)  Height: 5' 2.5" (1.588 m)    GEN:  The patient appears stated age and is in NAD. HEENT:  Normocephalic, atraumatic.  The mucous membranes are moist. The superficial temporal arteries are without ropiness or tenderness. CV:  RRR Lungs:  CTAB Neck/HEME:  There are no carotid bruits bilaterally.  Neurological examination:  Orientation: The patient is alert and oriented x3. Cranial nerves: There is good facial symmetry. The speech is fluent and clear. Soft palate rises symmetrically and there is no tongue deviation. Hearing is intact to conversational tone. Sensation: Sensation is intact to light touch throughout Motor: Strength is at least antigravity x4.  Movement examination: Tone: There is normal tone in the UE/LE Abnormal movements: there is postural tremor, at least mod bilaterally.  Better with a weight.   Coordination:  There is no decremation with RAM's, with any form of RAMS, including alternating supination and pronation of the forearm, hand opening and closing, finger taps, heel taps and toe taps.   I have reviewed and interpreted the following labs independently   Chemistry      Component Value Date/Time   NA 141 06/22/2019 0820   K 3.6 06/22/2019 0820   CL 103 06/22/2019 0820  CO2 29 06/22/2019 0820   BUN 16 06/22/2019 0820   CREATININE 0.72 06/22/2019 0820      Component Value Date/Time   CALCIUM 9.2 06/22/2019 0820   ALKPHOS 82 06/22/2019 0820   AST 18 06/22/2019 0820   AST 17 04/11/2013 0000   ALT 16 06/22/2019 0820   BILITOT 0.4 06/22/2019 0820     Lab Results  Component Value Date   TSH 2.05 06/22/2019      ASSESSMENT/PLAN:  1.  Essential Tremor  -increase primidone 50 mg - 3 in the AM, 2 at night.  -continue propranolol 60 mg bid.  Pt  doesn't want to increase this due to potential for bradycardia  -discussed 2nd line medications but she decided to hold on those for now after discussing the SE profile  -I talked to the patient about the logistics associated with DBS therapy.  I talked to the patient about risks/benefits/side effects of DBS therapy.  We talked about risks which included but were not limited to infection, paralysis, intraoperative seizure, death, stroke, bleeding around the electrode.   I talked to patient about fiducial placement 1 week prior to DBS therapy.  I talked to the patient about what to expect in the operating room, including the fact that this is an awake surgery.  We talked about battery placement as well as which is done under general anesthesia, generally approximately one week following the initial surgery.  We also talked about the fact that the patient will need to be off of medications for surgery.  The patient and family were given the opportunity to ask questions, which they did, and I answered them to the best of my ability today.  She is not interested in surgical interventions right now.  -try weighted glove off of [employer].  If helpful, can try readi-steadi.  Total time spent on today's visit was 45 minutes, including both face-to-face time and nonface-to-face time.  Time included that spent on review of records (prior notes available to me/labs/imaging if pertinent), discussing treatment and goals, answering patient's questions and coordinating care.  Cc:  Tonia Ghent, MD

## 2019-08-21 DIAGNOSIS — M545 Low back pain: Secondary | ICD-10-CM | POA: Diagnosis not present

## 2019-08-23 DIAGNOSIS — M47817 Spondylosis without myelopathy or radiculopathy, lumbosacral region: Secondary | ICD-10-CM | POA: Diagnosis not present

## 2019-08-25 ENCOUNTER — Telehealth: Payer: Self-pay

## 2019-08-25 DIAGNOSIS — I498 Other specified cardiac arrhythmias: Secondary | ICD-10-CM

## 2019-08-25 NOTE — Telephone Encounter (Signed)
Spoke with patient in regards to her request for the heart monitor. Patient is agreeable to wear the monitor for 14 days and the order was placed for the monitor. I advised the patient that it could take up to 7 business days to receive the monitor in the mail.    [facility] registration was completed.   Encouraged patient to call back with any questions or concerns.

## 2019-08-30 ENCOUNTER — Ambulatory Visit (INDEPENDENT_AMBULATORY_CARE_PROVIDER_SITE_OTHER): Payer: Medicare PPO

## 2019-08-30 DIAGNOSIS — I498 Other specified cardiac arrhythmias: Secondary | ICD-10-CM | POA: Diagnosis not present

## 2019-09-26 DIAGNOSIS — R002 Palpitations: Secondary | ICD-10-CM | POA: Diagnosis not present

## 2019-10-19 ENCOUNTER — Encounter: Payer: Self-pay | Admitting: Family Medicine

## 2019-10-20 ENCOUNTER — Other Ambulatory Visit: Payer: Self-pay | Admitting: Family Medicine

## 2019-10-20 MED ORDER — OMEPRAZOLE 20 MG PO CPDR: 20.0000 mg | DELAYED_RELEASE_CAPSULE | Freq: Every day | ORAL | 3 refills | Status: DC

## 2019-10-25 DIAGNOSIS — M545 Low back pain: Secondary | ICD-10-CM | POA: Diagnosis not present

## 2019-11-01 ENCOUNTER — Telehealth: Payer: Self-pay | Admitting: *Deleted

## 2019-11-01 NOTE — Telephone Encounter (Signed)
Patient called stating that she woke up this morning with a UTI. Patient stated that she has had plenty of UTI's and she has all of the classic symptoms. Advised patient that we do not any any appointment available today at the office, but can schedule her an appointment in the morning. Patient stated that she can not wait another day for treatment. Patient was given information on the Urgent Care off of [facility] in [place]. Patient stated that she will have to go there because she can not wait until tomorrow.

## 2019-11-01 NOTE — Telephone Encounter (Signed)
Please check on patient tomorrow since I was out of clinic today.  Thanks.

## 2019-11-02 NOTE — Telephone Encounter (Signed)
Message left for patient to return my call.  

## 2019-11-03 NOTE — Telephone Encounter (Signed)
Pt states that she increased her fluid intake and after a few hours her symptoms began to resolve. Pt states that she might have been extremely dehydrated. Pt was not seen at UC d/t symptoms resolving.  Pt feels okay at this point. Pt aware that I will pass this along to Dr Damita Dunnings  - pt advised to call back if any increase or return in symptoms.   Pt states that she has been having more problems with increased dehydration - becomes dehydrated faster than normal. Pt states that when this happens she starts having issues with urination and pain with urination. Pt states that this past time was similar to previous times.

## 2019-11-06 NOTE — Telephone Encounter (Signed)
Patients can have dysuria when they have highly concentrated urine.  It is possible that relative dehydration can cause symptoms similar to hers.  She is not on any medications that would typically cause dehydration.  It is reasonable to try to make sure she is drinking enough water to keep her urine clear or light-colored.  Please update me as needed.  Thanks.

## 2019-11-07 NOTE — Telephone Encounter (Signed)
Patient advised.

## 2019-11-30 DIAGNOSIS — M722 Plantar fascial fibromatosis: Secondary | ICD-10-CM | POA: Diagnosis not present

## 2019-12-12 DIAGNOSIS — H02883 Meibomian gland dysfunction of right eye, unspecified eyelid: Secondary | ICD-10-CM | POA: Diagnosis not present

## 2020-01-09 DIAGNOSIS — M76829 Posterior tibial tendinitis, unspecified leg: Secondary | ICD-10-CM | POA: Diagnosis not present

## 2020-01-18 DIAGNOSIS — S0502XA Injury of conjunctiva and corneal abrasion without foreign body, left eye, initial encounter: Secondary | ICD-10-CM | POA: Diagnosis not present

## 2020-01-21 DIAGNOSIS — S0502XA Injury of conjunctiva and corneal abrasion without foreign body, left eye, initial encounter: Secondary | ICD-10-CM | POA: Diagnosis not present

## 2020-01-23 DIAGNOSIS — S0502XA Injury of conjunctiva and corneal abrasion without foreign body, left eye, initial encounter: Secondary | ICD-10-CM | POA: Diagnosis not present

## 2020-01-24 DIAGNOSIS — H18513 Endothelial corneal dystrophy, bilateral: Secondary | ICD-10-CM | POA: Diagnosis not present

## 2020-01-24 DIAGNOSIS — M545 Low back pain: Secondary | ICD-10-CM | POA: Diagnosis not present

## 2020-01-29 DIAGNOSIS — S0502XD Injury of conjunctiva and corneal abrasion without foreign body, left eye, subsequent encounter: Secondary | ICD-10-CM | POA: Diagnosis not present

## 2020-01-30 ENCOUNTER — Ambulatory Visit: Payer: Medicare PPO | Admitting: Neurology

## 2020-01-30 DIAGNOSIS — M76822 Posterior tibial tendinitis, left leg: Secondary | ICD-10-CM | POA: Diagnosis not present

## 2020-01-30 DIAGNOSIS — M722 Plantar fascial fibromatosis: Secondary | ICD-10-CM | POA: Diagnosis not present

## 2020-01-30 DIAGNOSIS — S0502XD Injury of conjunctiva and corneal abrasion without foreign body, left eye, subsequent encounter: Secondary | ICD-10-CM | POA: Diagnosis not present

## 2020-02-06 DIAGNOSIS — H18832 Recurrent erosion of cornea, left eye: Secondary | ICD-10-CM | POA: Diagnosis not present

## 2020-02-07 DIAGNOSIS — M76822 Posterior tibial tendinitis, left leg: Secondary | ICD-10-CM | POA: Diagnosis not present

## 2020-02-13 DIAGNOSIS — H18832 Recurrent erosion of cornea, left eye: Secondary | ICD-10-CM | POA: Diagnosis not present

## 2020-02-15 DIAGNOSIS — M722 Plantar fascial fibromatosis: Secondary | ICD-10-CM | POA: Diagnosis not present

## 2020-02-15 DIAGNOSIS — M76822 Posterior tibial tendinitis, left leg: Secondary | ICD-10-CM | POA: Diagnosis not present

## 2020-02-20 DIAGNOSIS — M47817 Spondylosis without myelopathy or radiculopathy, lumbosacral region: Secondary | ICD-10-CM | POA: Diagnosis not present

## 2020-02-27 DIAGNOSIS — H18832 Recurrent erosion of cornea, left eye: Secondary | ICD-10-CM | POA: Diagnosis not present

## 2020-03-08 DIAGNOSIS — M79672 Pain in left foot: Secondary | ICD-10-CM | POA: Diagnosis not present

## 2020-03-08 DIAGNOSIS — M722 Plantar fascial fibromatosis: Secondary | ICD-10-CM | POA: Diagnosis not present

## 2020-03-08 DIAGNOSIS — G8918 Other acute postprocedural pain: Secondary | ICD-10-CM | POA: Diagnosis not present

## 2020-03-23 DIAGNOSIS — H18832 Recurrent erosion of cornea, left eye: Secondary | ICD-10-CM | POA: Diagnosis not present

## 2020-03-26 DIAGNOSIS — M25572 Pain in left ankle and joints of left foot: Secondary | ICD-10-CM | POA: Diagnosis not present

## 2020-03-27 DIAGNOSIS — H18832 Recurrent erosion of cornea, left eye: Secondary | ICD-10-CM | POA: Diagnosis not present

## 2020-04-01 DIAGNOSIS — H18832 Recurrent erosion of cornea, left eye: Secondary | ICD-10-CM | POA: Diagnosis not present

## 2020-04-02 DIAGNOSIS — M25572 Pain in left ankle and joints of left foot: Secondary | ICD-10-CM | POA: Diagnosis not present

## 2020-04-03 DIAGNOSIS — H18832 Recurrent erosion of cornea, left eye: Secondary | ICD-10-CM | POA: Diagnosis not present

## 2020-04-05 DIAGNOSIS — H35372 Puckering of macula, left eye: Secondary | ICD-10-CM | POA: Diagnosis not present

## 2020-04-05 DIAGNOSIS — H18832 Recurrent erosion of cornea, left eye: Secondary | ICD-10-CM | POA: Diagnosis not present

## 2020-04-05 DIAGNOSIS — H18593 Other hereditary corneal dystrophies, bilateral: Secondary | ICD-10-CM | POA: Diagnosis not present

## 2020-04-05 DIAGNOSIS — Z961 Presence of intraocular lens: Secondary | ICD-10-CM | POA: Diagnosis not present

## 2020-04-09 DIAGNOSIS — M25572 Pain in left ankle and joints of left foot: Secondary | ICD-10-CM | POA: Diagnosis not present

## 2020-04-11 NOTE — Progress Notes (Signed)
Assessment/Plan:    1.  Essential Tremor  -On primidone, 50 mg, 2 tablets in the morning and 2 tablets at night.  She had SE with higher dosages.  She really doesn't think that the primidone has helped at all and would like to try and wean off of it.  Weaning schedule given  -Continue propranolol, 60 mg twice per day.  -we discussed trial of topamax.  We will start low and go from there.  Will work up to 100 mg daily.  R/B/SE were discussed.  The opportunity to ask questions was given and they were answered to the best of my ability.  The patient expressed understanding and willingness to follow the outlined treatment protocols.  -Patient not interested in surgical interventions at this point in time.  Her neighbor recently had focused ultrasound and was happy with that.  -Have discussed weighted gloves.  Subjective:   Katie Hale was seen today in follow up for essential tremor.  My previous records were reviewed prior to todays visit.  She has not been interested in surgical interventions.  We increased her med last visit.  She emailed me back about a month later stating she felt more irritable and anxious and thought perhaps it was the medication.  I was not sure it was the medication, but ultimately I told her she could decrease it back to 2 tablets twice per day (from 3 in the morning and 2 at night).  She states that she is doing about the same.  Pt doesn't think that primidone has been helpful and would like to wean off of primidone  Current prescribed movement disorder medications: primidone 50 mg, 2 in the AM, 2 at bed (increased last visit) Propranolol 60 mg bid   ALLERGIES:  No Known Allergies  CURRENT MEDICATIONS:  Outpatient Encounter Medications as of 04/15/2020  Medication Sig   Calcium Carb-Cholecalciferol (CALCIUM + D3) 600-200 MG-UNIT TABS Take one tablet two times daily.   Cholecalciferol (VITAMIN D3) 2000 UNITS TABS Take 2,000 Units by mouth daily.    cycloSPORINE (RESTASIS) 0.05 % ophthalmic emulsion Place 1 drop into both eyes 2 (two) times daily.   fluticasone (FLONASE) 50 MCG/ACT nasal spray Place 1 spray into both nostrils daily.   Lactobacillus (PROBIOTIC ACIDOPHILUS PO) Take 1 tablet by mouth daily.   meloxicam (MOBIC) 15 MG tablet TAKE 1 TABLET(15 MG) BY MOUTH DAILY AS NEEDED FOR PAIN   Multiple Vitamin (MULTIVITAMIN) tablet Take 1 tablet by mouth daily.   omeprazole (PRILOSEC) 20 MG capsule Take 1-2 capsules (20-40 mg total) by mouth daily.   ondansetron (ZOFRAN) 4 MG tablet Take 1 tablet (4 mg total) by mouth every 8 (eight) hours as needed for nausea or vomiting.   primidone (MYSOLINE) 50 MG tablet 3 in the AM, 2 at night (Patient taking differently: Take 100 mg by mouth in the morning and at bedtime. )   propranolol (INDERAL) 60 MG tablet TAKE 1 TABLET(60 MG) BY MOUTH TWICE DAILY   triamcinolone cream (KENALOG) 0.1 % Apply 1 application topically 2 (two) times daily as needed.   ketorolac (ACULAR) 0.5 % ophthalmic solution Place into the left eye.   PRED FORTE 1 % ophthalmic suspension Place 1 drop into the left eye 4 (four) times daily.   VIGAMOX 0.5 % ophthalmic solution Place 1 drop into the left eye 4 (four) times daily.   [DISCONTINUED] UNABLE TO FIND Move free joint health - 1 tablet daily (Patient not taking: Reported on 04/15/2020)  No facility-administered encounter medications on file as of 04/15/2020.     Objective:    PHYSICAL EXAMINATION:    VITALS:   Vitals:   04/15/20 0755  BP: (!) 144/76  Pulse: (!) 59  SpO2: 98%  Weight: 185 lb (83.9 kg)  Height: 5\' 2"  (1.575 m)    GEN:  The patient appears stated age and is in NAD. HEENT:  Normocephalic, atraumatic.  The mucous membranes are moist. The superficial temporal arteries are without ropiness or tenderness. CV:  Loletha Grayer.  regular Lungs:  CTAB Neck/HEME:  There are no carotid bruits bilaterally.  Neurological examination:  Orientation: The  patient is alert and oriented x3. Cranial nerves: There is good facial symmetry. The speech is fluent and clear. Soft palate rises symmetrically and there is no tongue deviation. Hearing is intact to conversational tone. Sensation: Sensation is intact to light touch throughout Motor: Strength is at least antigravity x4.  Movement examination: Tone: There is normal tone in the UE/LE Abnormal movements: there is mod LUE intention tremor and mild to mod on the R.  Mild trouble with archimedes spirals Coordination:  There is no decremation with RAM's Gait and Station: The patient has no difficulty arising out of a deep-seated chair without the use of the hands. The patient's stride length is good I have reviewed and interpreted the following labs independently   Chemistry      Component Value Date/Time   NA 141 06/22/2019 0820   K 3.6 06/22/2019 0820   CL 103 06/22/2019 0820   CO2 29 06/22/2019 0820   BUN 16 06/22/2019 0820   CREATININE 0.72 06/22/2019 0820      Component Value Date/Time   CALCIUM 9.2 06/22/2019 0820   ALKPHOS 82 06/22/2019 0820   AST 18 06/22/2019 0820   AST 17 04/11/2013 0000   ALT 16 06/22/2019 0820   BILITOT 0.4 06/22/2019 0820      No results found for: WBC, HGB, HCT, MCV, PLT Lab Results  Component Value Date   TSH 2.05 06/22/2019     Chemistry      Component Value Date/Time   NA 141 06/22/2019 0820   K 3.6 06/22/2019 0820   CL 103 06/22/2019 0820   CO2 29 06/22/2019 0820   BUN 16 06/22/2019 0820   CREATININE 0.72 06/22/2019 0820      Component Value Date/Time   CALCIUM 9.2 06/22/2019 0820   ALKPHOS 82 06/22/2019 0820   AST 18 06/22/2019 0820   AST 17 04/11/2013 0000   ALT 16 06/22/2019 0820   BILITOT 0.4 06/22/2019 0820         Total time spent on today's visit was 30 minutes, including both face-to-face time and nonface-to-face time.  Time included that spent on review of records (prior notes available to me/labs/imaging if pertinent),  discussing treatment and goals, answering patient's questions and coordinating care.  Cc:  Tonia Ghent, MD

## 2020-04-15 ENCOUNTER — Telehealth: Payer: Self-pay | Admitting: Neurology

## 2020-04-15 ENCOUNTER — Encounter: Payer: Self-pay | Admitting: Neurology

## 2020-04-15 ENCOUNTER — Ambulatory Visit: Payer: Medicare PPO | Admitting: Neurology

## 2020-04-15 ENCOUNTER — Other Ambulatory Visit: Payer: Self-pay

## 2020-04-15 VITALS — BP 144/76 | HR 59 | Ht 62.0 in | Wt 185.0 lb

## 2020-04-15 DIAGNOSIS — G250 Essential tremor: Secondary | ICD-10-CM | POA: Diagnosis not present

## 2020-04-15 MED ORDER — TOPIRAMATE 25 MG PO CPSP: ORAL_CAPSULE | ORAL | 0 refills | Status: DC

## 2020-04-15 MED ORDER — TOPIRAMATE 100 MG PO TABS: 100.0000 mg | ORAL_TABLET | Freq: Every day | ORAL | 1 refills | Status: DC

## 2020-04-15 NOTE — Telephone Encounter (Signed)
See chart. Its just a titration dose.  The 25 mg dose is only to work up to the 100mg .  I sent two RX's today.  The 100 mg dosage was a 90 days supply

## 2020-04-15 NOTE — Telephone Encounter (Signed)
Dr Tat the pharmacy is requesting a 40 day rx. Is this ok?

## 2020-04-15 NOTE — Patient Instructions (Signed)
Start topamax, 25 mg, 1 tablet daily for 1 week, then 2 tablets daily for 1 week and then 3 tablets daily for 1 week and then you will start topamax (aka topiramate) 100 mg daily  Weaning primidone:  Week 1: Take primidone 50 mg: 2 in the AM, 1 at night Week 2: Take primidone 50 mg, 1 in the AM, 1 at night Week 3: Take primidone 50 mg, 1 in the AM only Week 4: STOP primidone

## 2020-04-15 NOTE — Telephone Encounter (Signed)
Spoke with pharmacist who states the request was sent over in error. She states they have canceled the request on there end.

## 2020-04-16 DIAGNOSIS — H18832 Recurrent erosion of cornea, left eye: Secondary | ICD-10-CM | POA: Diagnosis not present

## 2020-04-16 DIAGNOSIS — H18592 Other hereditary corneal dystrophies, left eye: Secondary | ICD-10-CM | POA: Diagnosis not present

## 2020-04-23 ENCOUNTER — Ambulatory Visit: Payer: Medicare PPO | Admitting: Neurology

## 2020-05-07 ENCOUNTER — Other Ambulatory Visit: Payer: Self-pay | Admitting: Family Medicine

## 2020-05-07 DIAGNOSIS — Z1231 Encounter for screening mammogram for malignant neoplasm of breast: Secondary | ICD-10-CM

## 2020-05-16 ENCOUNTER — Other Ambulatory Visit: Payer: Self-pay | Admitting: Neurology

## 2020-05-29 ENCOUNTER — Other Ambulatory Visit: Payer: Self-pay

## 2020-05-29 ENCOUNTER — Telehealth: Payer: Self-pay | Admitting: Neurology

## 2020-05-29 MED ORDER — TOPIRAMATE 25 MG PO CPSP: ORAL_CAPSULE | ORAL | 0 refills | Status: DC

## 2020-05-29 NOTE — Telephone Encounter (Signed)
Spoke with pharmacy to seek clarification on the rx request that was sent. Pharmacy stated to ignore that request because it was sent by their system automatically.   She states the rx is almost ready for pick up. And they will notify the patient once the rx is ready for pick up.

## 2020-06-03 ENCOUNTER — Other Ambulatory Visit: Payer: Self-pay | Admitting: Family Medicine

## 2020-06-03 DIAGNOSIS — E785 Hyperlipidemia, unspecified: Secondary | ICD-10-CM

## 2020-06-03 DIAGNOSIS — H18832 Recurrent erosion of cornea, left eye: Secondary | ICD-10-CM | POA: Diagnosis not present

## 2020-06-03 DIAGNOSIS — G250 Essential tremor: Secondary | ICD-10-CM

## 2020-06-03 DIAGNOSIS — M899 Disorder of bone, unspecified: Secondary | ICD-10-CM

## 2020-06-12 ENCOUNTER — Other Ambulatory Visit: Payer: Self-pay | Admitting: Family Medicine

## 2020-06-19 ENCOUNTER — Telehealth: Payer: Self-pay

## 2020-06-19 ENCOUNTER — Other Ambulatory Visit: Payer: Self-pay

## 2020-06-19 ENCOUNTER — Ambulatory Visit
Admission: RE | Admit: 2020-06-19 | Discharge: 2020-06-19 | Disposition: A | Discharge status: Home / Self Care | Payer: Medicare PPO | Source: Ambulatory Visit | Attending: Family Medicine | Admitting: Family Medicine

## 2020-06-19 DIAGNOSIS — Z1231 Encounter for screening mammogram for malignant neoplasm of breast: Secondary | ICD-10-CM | POA: Insufficient documentation

## 2020-06-19 NOTE — Telephone Encounter (Signed)
LVM for pt to let her know that per Dr. Damita Dunnings her mammogram was normal.  Leamon Arnt

## 2020-06-20 ENCOUNTER — Other Ambulatory Visit (INDEPENDENT_AMBULATORY_CARE_PROVIDER_SITE_OTHER): Payer: Medicare PPO

## 2020-06-20 DIAGNOSIS — M899 Disorder of bone, unspecified: Secondary | ICD-10-CM

## 2020-06-20 DIAGNOSIS — G250 Essential tremor: Secondary | ICD-10-CM | POA: Diagnosis not present

## 2020-06-20 DIAGNOSIS — E785 Hyperlipidemia, unspecified: Secondary | ICD-10-CM

## 2020-06-20 DIAGNOSIS — M949 Disorder of cartilage, unspecified: Secondary | ICD-10-CM | POA: Diagnosis not present

## 2020-06-20 LAB — COMPREHENSIVE METABOLIC PANEL
ALT: 11 U/L (ref 0–35)
AST: 15 U/L (ref 0–37)
Albumin: 4.2 g/dL (ref 3.5–5.2)
Alkaline Phosphatase: 62 U/L (ref 39–117)
BUN: 17 mg/dL (ref 6–23)
CO2: 29 mEq/L (ref 19–32)
Calcium: 9.1 mg/dL (ref 8.4–10.5)
Chloride: 106 mEq/L (ref 96–112)
Creatinine, Ser: 0.75 mg/dL (ref 0.40–1.20)
GFR: 77.36 mL/min (ref >60.00)
Glucose, Bld: 95 mg/dL (ref 70–99)
Potassium: 4.4 mEq/L (ref 3.5–5.1)
Sodium: 140 mEq/L (ref 135–145)
Total Bilirubin: 0.6 mg/dL (ref 0.2–1.2)
Total Protein: 6.4 g/dL (ref 6.0–8.3)

## 2020-06-20 LAB — LIPID PANEL
Cholesterol: 214 mg/dL — ABNORMAL HIGH (ref 0–200)
HDL: 58.6 mg/dL (ref >39.00)
LDL Cholesterol: 135 mg/dL — ABNORMAL HIGH (ref 0–99)
NonHDL: 155.51
Total CHOL/HDL Ratio: 4
Triglycerides: 104 mg/dL (ref 0.0–149.0)
VLDL: 20.8 mg/dL (ref 0.0–40.0)

## 2020-06-20 LAB — VITAMIN D 25 HYDROXY (VIT D DEFICIENCY, FRACTURES): VITD: 47.75 ng/mL (ref 30.00–100.00)

## 2020-06-20 LAB — TSH: TSH: 1.58 u[IU]/mL (ref 0.35–4.50)

## 2020-06-27 ENCOUNTER — Ambulatory Visit (INDEPENDENT_AMBULATORY_CARE_PROVIDER_SITE_OTHER): Payer: Medicare PPO | Admitting: Family Medicine

## 2020-06-27 ENCOUNTER — Other Ambulatory Visit: Payer: Self-pay

## 2020-06-27 ENCOUNTER — Encounter: Payer: Self-pay | Admitting: Family Medicine

## 2020-06-27 VITALS — BP 138/80 | HR 64 | Temp 98.5°F | Ht 61.5 in | Wt 178.0 lb

## 2020-06-27 DIAGNOSIS — Z0000 Encounter for general adult medical examination without abnormal findings: Secondary | ICD-10-CM | POA: Diagnosis not present

## 2020-06-27 DIAGNOSIS — M722 Plantar fascial fibromatosis: Secondary | ICD-10-CM

## 2020-06-27 DIAGNOSIS — Z7189 Other specified counseling: Secondary | ICD-10-CM

## 2020-06-27 DIAGNOSIS — G250 Essential tremor: Secondary | ICD-10-CM | POA: Diagnosis not present

## 2020-06-27 DIAGNOSIS — M1990 Unspecified osteoarthritis, unspecified site: Secondary | ICD-10-CM

## 2020-06-27 MED ORDER — MELOXICAM 15 MG PO TABS: ORAL_TABLET | ORAL | 3 refills | Status: DC

## 2020-06-27 MED ORDER — OMEPRAZOLE 20 MG PO CPDR: 20.0000 mg | DELAYED_RELEASE_CAPSULE | Freq: Every day | ORAL | 3 refills | Status: DC

## 2020-06-27 MED ORDER — TRIAMCINOLONE ACETONIDE 0.1 % EX CREA: 1.0000 "application " | TOPICAL_CREAM | Freq: Two times a day (BID) | CUTANEOUS | 3 refills | Status: DC | PRN

## 2020-06-27 MED ORDER — PRED FORTE 1 % OP SUSP: 1.0000 [drp] | Freq: Two times a day (BID) | OPHTHALMIC | Status: DC

## 2020-06-27 MED ORDER — ONDANSETRON HCL 4 MG PO TABS: 4.0000 mg | ORAL_TABLET | Freq: Three times a day (TID) | ORAL | 2 refills | Status: DC | PRN

## 2020-06-27 NOTE — Patient Instructions (Signed)
Don't change your meds for now.  We'll call about seeing ortho.  Take care.  Glad to see you.

## 2020-06-27 NOTE — Progress Notes (Signed)
This visit occurred during the SARS-CoV-2 public health emergency.  Safety protocols were in place, including screening questions prior to the visit, additional usage of staff PPE, and extensive cleaning of exam room while observing appropriate contact time as indicated for disinfecting solutions.  I have personally reviewed the Medicare Annual Wellness questionnaire and have noted 1. The patient's medical and social history 2. Their use of alcohol, tobacco or illicit drugs 3. Their current medications and supplements 4. The patient's functional ability including ADL's, fall risks, home safety risks and hearing or visual             impairment. 5. Diet and physical activities 6. Evidence for depression or mood disorders  The patients weight, height, BMI have been recorded in the chart and visual acuity is per eye clinic.  I have made referrals, counseling and provided education to the patient based review of the above and I have provided the pt with a written personalized care plan for preventive services.  Provider list updated- see scanned forms.  Routine anticipatory guidance given to patient.  See health maintenance. The possibility exists that previously documented standard health maintenance information may have been brought forward from a previous encounter into this note.  If needed, that same information has been updated to reflect the current situation based on today's encounter.    Flu 2021 Shingles 2019 PNA up-to-date Tetanus 2008 COVID-vaccine 2021 Colonoscopy 2020 Breast cancer screening 2020 Bone density test 2017, deferred 2022 given pandemic.  Discussed with patient Advance directive - daughter designated if patient were incapacitated. Cognitive function addressed- see scanned forms- and if abnormal then additional documentation follows.   She cut out all caffeine and all fluttering resolved.    [place] 04/15/20.  Followed by eye clinic.  She is gradually improving.   She  is putting up with arthritis at baseline.  meloxicam helps.  She can continue as is.  No ADE on med.  Taking with food.   Saw Dr. Carles Collet re: tremor. Didn't tolerate topamax.  Still on beta blocker at baseline.  Compliant.  She is able to tolerate propranolol.  She is putting up with foot pain, until she finishes recovery with her eye.  L foot pain at the plantar side of the heel.  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  She wanted to get a second opinion through orthopedics.  Referral placed.  H/o constipation. Used miralax prn.  It works.  No blood in stool.   PMH and SH reviewed  Meds, vitals, and allergies reviewed.   ROS: Per HPI.  Unless specifically indicated otherwise in HPI, the patient denies:  General: fever. Eyes: acute vision changes ENT: sore throat Cardiovascular: chest pain Respiratory: SOB GI: vomiting GU: dysuria Musculoskeletal: acute back pain Derm: acute rash Neuro: acute motor dysfunction Psych: worsening mood Endocrine: polydipsia Heme: bleeding Allergy: hayfever  GEN: nad, alert and oriented HEENT: NCAT NECK: supple w/o LA CV: rrr. PULM: ctab, no inc wob ABD: soft, +bs EXT: no edema SKIN: no acute rash Left foot tender to palpation at the origin of the plantar fascia, at the left heel.  Medial and lateral malleolar line not tender.  Achilles nontender.

## 2020-06-30 DIAGNOSIS — M722 Plantar fascial fibromatosis: Secondary | ICD-10-CM | POA: Insufficient documentation

## 2020-06-30 NOTE — Assessment & Plan Note (Signed)
Advance directive- daughter designated if patient were incapacitated.   

## 2020-06-30 NOTE — Assessment & Plan Note (Signed)
Flu 2021 Shingles 2019 PNA up-to-date Tetanus 2008 COVID-vaccine 2021 Colonoscopy 2020 Breast cancer screening 2020 Bone density test 2017, deferred 2022 given pandemic.  Discussed with patient Advance directive - daughter designated if patient were incapacitated. Cognitive function addressed- see scanned forms- and if abnormal then additional documentation follows.

## 2020-06-30 NOTE — Assessment & Plan Note (Signed)
Refer to orthopedics 

## 2020-06-30 NOTE — Assessment & Plan Note (Signed)
She is putting up with arthritis at baseline.  meloxicam helps.  She can continue as is.  No ADE on med.  Taking with food.  Continue meloxicam.

## 2020-06-30 NOTE — Assessment & Plan Note (Signed)
Per neurology.  Continue propranolol for now.

## 2020-07-17 ENCOUNTER — Other Ambulatory Visit: Payer: Self-pay | Admitting: Neurology

## 2020-07-17 DIAGNOSIS — H16223 Keratoconjunctivitis sicca, not specified as Sjogren's, bilateral: Secondary | ICD-10-CM | POA: Diagnosis not present

## 2020-08-05 ENCOUNTER — Other Ambulatory Visit: Payer: Self-pay | Admitting: Family Medicine

## 2020-08-12 DIAGNOSIS — M6702 Short Achilles tendon (acquired), left ankle: Secondary | ICD-10-CM | POA: Diagnosis not present

## 2020-08-12 DIAGNOSIS — M722 Plantar fascial fibromatosis: Secondary | ICD-10-CM | POA: Diagnosis not present

## 2020-08-20 ENCOUNTER — Other Ambulatory Visit: Payer: Self-pay | Admitting: Family Medicine

## 2020-08-26 DIAGNOSIS — M722 Plantar fascial fibromatosis: Secondary | ICD-10-CM | POA: Diagnosis not present

## 2020-08-28 DIAGNOSIS — M722 Plantar fascial fibromatosis: Secondary | ICD-10-CM | POA: Diagnosis not present

## 2020-09-02 DIAGNOSIS — H16223 Keratoconjunctivitis sicca, not specified as Sjogren's, bilateral: Secondary | ICD-10-CM | POA: Diagnosis not present

## 2020-09-07 ENCOUNTER — Other Ambulatory Visit: Payer: Self-pay | Admitting: Family Medicine

## 2020-09-09 NOTE — Telephone Encounter (Signed)
Refill request for Mobic 15 mg tablets  LOV - 06/27/20 Next OV - not scheduled Last refill - 06/27/20 #90/3

## 2020-09-10 NOTE — Telephone Encounter (Signed)
Does she want this sent to a different pharmacy?  Mail order?  Okay to send.  Thanks.

## 2020-09-12 DIAGNOSIS — M722 Plantar fascial fibromatosis: Secondary | ICD-10-CM | POA: Diagnosis not present

## 2020-09-17 DIAGNOSIS — M722 Plantar fascial fibromatosis: Secondary | ICD-10-CM | POA: Diagnosis not present

## 2020-09-23 DIAGNOSIS — M47817 Spondylosis without myelopathy or radiculopathy, lumbosacral region: Secondary | ICD-10-CM | POA: Diagnosis not present

## 2020-09-24 DIAGNOSIS — M722 Plantar fascial fibromatosis: Secondary | ICD-10-CM | POA: Diagnosis not present

## 2020-10-02 DIAGNOSIS — M47817 Spondylosis without myelopathy or radiculopathy, lumbosacral region: Secondary | ICD-10-CM | POA: Diagnosis not present

## 2020-10-08 DIAGNOSIS — M722 Plantar fascial fibromatosis: Secondary | ICD-10-CM | POA: Diagnosis not present

## 2020-10-14 DIAGNOSIS — Z23 Encounter for immunization: Secondary | ICD-10-CM | POA: Diagnosis not present

## 2020-10-14 NOTE — Progress Notes (Signed)
Assessment/Plan:    1.  Essential Tremor  -She will continue propranolol, 60 mg twice per day.  -She did not tolerate topiramate or higher dosages of primidone (and did not find lower dosages of either effective)  -she wants to try low dose xanax, 0.25 mg, for prn social use.  Told her I don't want her using it more than 3 days per week.  Understands addictive potential and risks for falls.  R/B/SE were discussed.  The opportunity to ask questions was given and they were answered to the best of my ability.  The patient expressed understanding and willingness to follow the outlined treatment protocols.  2.  F/u 6 months.  Subjective:   Katie Hale was seen today in follow up for essential tremor.  My previous records were reviewed prior to todays visit.  Last visit, the patient wanted to be weaned off of primidone as she did not think it was helpful.  She decided to start on topiramate, in addition to her propranolol.  She emailed me to state that she had some paresthesias with the medication (common) along with some dizziness.  We decided to leave her at a low dose of the medication, only 50 mg.  She tried to increase it, but the feeling of dizziness and what she describes as "out of body experience" came back and we reduced the dosage.  This was just not effective for tremor and ultimately we decided to take her off the medication.  Pt states tremor is stable but at least the L is worse than the R.  She is able to eat.  Holding a plate in the L hand creates "a problem."  In certain settings, the anxiety will make it worse.  She asks about taking anxiety medication to help if needed.    Current prescribed movement disorder medications: Propranolol, 60 mg twice per day   PREVIOUS MEDICATIONS: Topamax (higher dosages caused dizziness and lower dosages ineffective); primidone (patient did not find it effective at 100 mg twice per day and had side effects with higher dosages)  ALLERGIES:    Allergies  Allergen Reactions  . Topamax [Topiramate] Other (See Comments)    "out of body experience."  Intolerant.      CURRENT MEDICATIONS:  Outpatient Encounter Medications as of 10/16/2020  Medication Sig  . Calcium Carb-Cholecalciferol (CALCIUM + D3) 600-200 MG-UNIT TABS Take one tablet two times daily.  . Cholecalciferol (VITAMIN D3) 2000 UNITS TABS Take 2,000 Units by mouth daily.  . fluticasone (FLONASE) 50 MCG/ACT nasal spray SHAKE LIQUID AND USE 1 SPRAY IN EACH NOSTRIL DAILY  . Lactobacillus (PROBIOTIC ACIDOPHILUS PO) Take 1 tablet by mouth daily.  . meloxicam (MOBIC) 15 MG tablet TAKE 1 TABLET(15 MG) BY MOUTH DAILY AS NEEDED FOR PAIN  . Multiple Vitamin (MULTIVITAMIN) tablet Take 1 tablet by mouth daily.  Marland Kitchen omeprazole (PRILOSEC) 20 MG capsule Take 1-2 capsules (20-40 mg total) by mouth daily.  . ondansetron (ZOFRAN) 4 MG tablet Take 1 tablet (4 mg total) by mouth every 8 (eight) hours as needed for nausea or vomiting.  . propranolol (INDERAL) 60 MG tablet TAKE 1 TABLET(60 MG) BY MOUTH TWICE DAILY  . Varenicline Tartrate (TYRVAYA) 0.03 MG/ACT SOLN Tyrvaya 0.03 mg/spray nasal spray  . [DISCONTINUED] PRED FORTE 1 % ophthalmic suspension Place 1 drop into the left eye in the morning and at bedtime.  . [DISCONTINUED] triamcinolone (KENALOG) 0.1 % Apply 1 application topically 2 (two) times daily as needed.   No facility-administered  encounter medications on file as of 10/16/2020.     Objective:    PHYSICAL EXAMINATION:    VITALS:   Vitals:   10/16/20 0935  BP: 124/68  Pulse: (!) 56  SpO2: 98%  Weight: 179 lb (81.2 kg)  Height: 5' 2.5" (1.588 m)    GEN:  The patient appears stated age and is in NAD. HEENT:  Normocephalic, atraumatic.  The mucous membranes are moist. The superficial temporal arteries are without ropiness or tenderness. CV:  Loletha Grayer.  regular Lungs:  CTAB Neck/HEME:  There are no carotid bruits bilaterally.  Neurological examination:  Orientation:  The patient is alert and oriented x3. Cranial nerves: There is good facial symmetry. The speech is fluent and clear. Soft palate rises symmetrically and there is no tongue deviation. Hearing is intact to conversational tone. Sensation: Sensation is intact to light touch throughout Motor: Strength is at least antigravity x4.  Movement examination: Tone: There is normal tone in the UE/LE Abnormal movements: rare LUE rest tremor.  There is a postural tremor bilaterally, mod, L>R Coordination:  There is no decremation with RAM's, with any form of RAMS, including alternating supination and pronation of the forearm, hand opening and closing, finger taps, heel taps and toe taps.  I have reviewed and interpreted the following labs independently   Chemistry      Component Value Date/Time   NA 140 06/20/2020 0809   K 4.4 06/20/2020 0809   CL 106 06/20/2020 0809   CO2 29 06/20/2020 0809   BUN 17 06/20/2020 0809   CREATININE 0.75 06/20/2020 0809      Component Value Date/Time   CALCIUM 9.1 06/20/2020 0809   ALKPHOS 62 06/20/2020 0809   AST 15 06/20/2020 0809   AST 17 04/11/2013 0000   ALT 11 06/20/2020 0809   BILITOT 0.6 06/20/2020 0809      No results found for: WBC, HGB, HCT, MCV, PLT Lab Results  Component Value Date   TSH 1.58 06/20/2020     Chemistry      Component Value Date/Time   NA 140 06/20/2020 0809   K 4.4 06/20/2020 0809   CL 106 06/20/2020 0809   CO2 29 06/20/2020 0809   BUN 17 06/20/2020 0809   CREATININE 0.75 06/20/2020 0809      Component Value Date/Time   CALCIUM 9.1 06/20/2020 0809   ALKPHOS 62 06/20/2020 0809   AST 15 06/20/2020 0809   AST 17 04/11/2013 0000   ALT 11 06/20/2020 0809   BILITOT 0.6 06/20/2020 0809         Total time spent on today's visit was 20 minutes, including both face-to-face time and nonface-to-face time.  Time included that spent on review of records (prior notes available to me/labs/imaging if pertinent), discussing treatment  and goals, answering patient's questions and coordinating care.  Cc:  Tonia Ghent, MD

## 2020-10-16 ENCOUNTER — Encounter: Payer: Self-pay | Admitting: Neurology

## 2020-10-16 ENCOUNTER — Ambulatory Visit: Payer: Medicare PPO | Admitting: Neurology

## 2020-10-16 ENCOUNTER — Other Ambulatory Visit: Payer: Self-pay

## 2020-10-16 VITALS — BP 124/68 | HR 56 | Ht 62.5 in | Wt 179.0 lb

## 2020-10-16 DIAGNOSIS — G250 Essential tremor: Secondary | ICD-10-CM | POA: Diagnosis not present

## 2020-10-16 MED ORDER — ALPRAZOLAM 0.25 MG PO TABS: 0.2500 mg | ORAL_TABLET | Freq: Every day | ORAL | 1 refills | Status: DC | PRN

## 2020-10-16 NOTE — Patient Instructions (Signed)
Try the alprazolam (xanax) for social use only.  Try it at home first so you know how you react to that.  The physicians and staff at [facility] are committed to providing excellent care. You may receive a survey requesting feedback about your experience at our office. We strive to receive "very good" responses to the survey questions. If you feel that your experience would prevent you from giving the office a "very good " response, please contact our office to try to remedy the situation. We may be reached at [phone]. Thank you for taking the time out of your busy day to complete the survey.

## 2020-11-06 DIAGNOSIS — M25562 Pain in left knee: Secondary | ICD-10-CM | POA: Diagnosis not present

## 2020-11-11 DIAGNOSIS — M47817 Spondylosis without myelopathy or radiculopathy, lumbosacral region: Secondary | ICD-10-CM | POA: Diagnosis not present

## 2020-11-25 ENCOUNTER — Other Ambulatory Visit: Payer: Self-pay

## 2020-11-25 MED ORDER — OMEPRAZOLE 20 MG PO CPDR: 20.0000 mg | DELAYED_RELEASE_CAPSULE | Freq: Every day | ORAL | 3 refills | Status: DC

## 2020-12-02 DIAGNOSIS — H16223 Keratoconjunctivitis sicca, not specified as Sjogren's, bilateral: Secondary | ICD-10-CM | POA: Diagnosis not present

## 2021-01-29 DIAGNOSIS — M1711 Unilateral primary osteoarthritis, right knee: Secondary | ICD-10-CM | POA: Diagnosis not present

## 2021-02-12 DIAGNOSIS — Z23 Encounter for immunization: Secondary | ICD-10-CM | POA: Diagnosis not present

## 2021-04-02 DIAGNOSIS — M47817 Spondylosis without myelopathy or radiculopathy, lumbosacral region: Secondary | ICD-10-CM | POA: Diagnosis not present

## 2021-04-03 ENCOUNTER — Other Ambulatory Visit: Payer: Self-pay | Admitting: Family Medicine

## 2021-04-03 DIAGNOSIS — Z1231 Encounter for screening mammogram for malignant neoplasm of breast: Secondary | ICD-10-CM

## 2021-04-14 DIAGNOSIS — M47817 Spondylosis without myelopathy or radiculopathy, lumbosacral region: Secondary | ICD-10-CM | POA: Diagnosis not present

## 2021-04-18 NOTE — Progress Notes (Signed)
Assessment/Plan:    1.  Essential Tremor  -She will continue propranolol, 60 mg twice per day.  -She did not tolerate topiramate or higher dosages of primidone (and did not find lower dosages of either effective)  -She tried xanax but made too sleepy  -discussed surgical interventions again, both focused ultrasound and DBS.  She is not interested.  -discussed Cala Trio.  She may be interested in that.  Discussed cost implications and efficacy.  She thinks she would like to trial it.  Got her measurements today.  The left hand is much worse than the right and she thinks she would like to trial that first, even though she is right-hand dominant.   Subjective:   Katie Hale was seen today in follow up for essential tremor.  My previous records were reviewed prior to todays visit.  Last visit, the patient wanted to try low-dose Xanax for social use only.  I told her I really did not want her to use it very often, which she really has not done.  PDMP is reviewed.  She filled it 1 time, in May, 2022 for 30 pills.  She states that she tried 1 pill, and it really made her too sleepy.  Tremor is about the same.   She did just got an injection for bursitis of prednisone and is more shaky today.  She is walking a mile and half way.     Current prescribed movement disorder medications: Propranolol, 60 mg twice per day Xanax, 0.25 mg rarely   PREVIOUS MEDICATIONS: Topamax (higher dosages caused dizziness and lower dosages ineffective); primidone (patient did not find it effective at 100 mg twice per day and had side effects with higher dosages)  ALLERGIES:   Allergies  Allergen Reactions   Topamax [Topiramate] Other (See Comments)    "out of body experience."  Intolerant.      CURRENT MEDICATIONS:  Outpatient Encounter Medications as of 04/22/2021  Medication Sig   ALPRAZolam (XANAX) 0.25 MG tablet Take 1 tablet (0.25 mg total) by mouth daily as needed for anxiety.   Calcium  Carb-Cholecalciferol (CALCIUM + D3) 600-200 MG-UNIT TABS Take one tablet two times daily.   Cholecalciferol (VITAMIN D3) 2000 UNITS TABS Take 2,000 Units by mouth daily.   fluticasone (FLONASE) 50 MCG/ACT nasal spray SHAKE LIQUID AND USE 1 SPRAY IN EACH NOSTRIL DAILY   Lactobacillus (PROBIOTIC ACIDOPHILUS PO) Take 1 tablet by mouth daily.   meloxicam (MOBIC) 15 MG tablet TAKE 1 TABLET(15 MG) BY MOUTH DAILY AS NEEDED FOR PAIN   Multiple Vitamin (MULTIVITAMIN) tablet Take 1 tablet by mouth daily.   omeprazole (PRILOSEC) 20 MG capsule Take 1-2 capsules (20-40 mg total) by mouth daily.   ondansetron (ZOFRAN) 4 MG tablet Take 1 tablet (4 mg total) by mouth every 8 (eight) hours as needed for nausea or vomiting.   propranolol (INDERAL) 60 MG tablet TAKE 1 TABLET(60 MG) BY MOUTH TWICE DAILY   Varenicline Tartrate (TYRVAYA) 0.03 MG/ACT SOLN Tyrvaya 0.03 mg/spray nasal spray   No facility-administered encounter medications on file as of 04/22/2021.     Objective:    PHYSICAL EXAMINATION:    VITALS:   Vitals:   04/22/21 1048  BP: 131/72  Pulse: 95  SpO2: 95%  Weight: 184 lb 3.2 oz (83.6 kg)  Height: 5\' 5"  (1.651 m)     GEN:  The patient appears stated age and is in NAD. HEENT:  Normocephalic, atraumatic.  The mucous membranes are moist. The superficial temporal  arteries are without ropiness or tenderness. CV:  Loletha Grayer.  regular Lungs:  CTAB Neck/HEME:  There are no carotid bruits bilaterally.  Neurological examination:  Orientation: The patient is alert and oriented x3. Cranial nerves: There is good facial symmetry. The speech is fluent and clear. Soft palate rises symmetrically and there is no tongue deviation. Hearing is intact to conversational tone. Sensation: Sensation is intact to light touch throughout Motor: Strength is at least antigravity x4.  Movement examination: Tone: There is normal tone in the UE/LE Abnormal movements: rare LUE rest tremor.  There is a postural tremor  bilaterally, mod, L>R Coordination:  There is no decremation with RAM's, with any form of RAMS, including alternating supination and pronation of the forearm, hand opening and closing, finger taps, heel taps and toe taps.  I have reviewed and interpreted the following labs independently   Chemistry      Component Value Date/Time   NA 140 06/20/2020 0809   K 4.4 06/20/2020 0809   CL 106 06/20/2020 0809   CO2 29 06/20/2020 0809   BUN 17 06/20/2020 0809   CREATININE 0.75 06/20/2020 0809      Component Value Date/Time   CALCIUM 9.1 06/20/2020 0809   ALKPHOS 62 06/20/2020 0809   AST 15 06/20/2020 0809   AST 17 04/11/2013 0000   ALT 11 06/20/2020 0809   BILITOT 0.6 06/20/2020 0809      No results found for: WBC, HGB, HCT, MCV, PLT Lab Results  Component Value Date   TSH 1.58 06/20/2020     Chemistry      Component Value Date/Time   NA 140 06/20/2020 0809   K 4.4 06/20/2020 0809   CL 106 06/20/2020 0809   CO2 29 06/20/2020 0809   BUN 17 06/20/2020 0809   CREATININE 0.75 06/20/2020 0809      Component Value Date/Time   CALCIUM 9.1 06/20/2020 0809   ALKPHOS 62 06/20/2020 0809   AST 15 06/20/2020 0809   AST 17 04/11/2013 0000   ALT 11 06/20/2020 0809   BILITOT 0.6 06/20/2020 0809         Total time spent on today's visit was 32 minutes, including both face-to-face time and nonface-to-face time.  Time included that spent on review of records (prior notes available to me/labs/imaging if pertinent), discussing treatment and goals, answering patient's questions and coordinating care.  Cc:  Tonia Ghent, MD

## 2021-04-21 DIAGNOSIS — M7062 Trochanteric bursitis, left hip: Secondary | ICD-10-CM | POA: Diagnosis not present

## 2021-04-21 DIAGNOSIS — M7061 Trochanteric bursitis, right hip: Secondary | ICD-10-CM | POA: Diagnosis not present

## 2021-04-22 ENCOUNTER — Other Ambulatory Visit: Payer: Self-pay

## 2021-04-22 ENCOUNTER — Encounter: Payer: Self-pay | Admitting: Neurology

## 2021-04-22 ENCOUNTER — Ambulatory Visit: Payer: Medicare PPO | Admitting: Neurology

## 2021-04-22 VITALS — BP 131/72 | HR 95 | Ht 65.0 in | Wt 184.2 lb

## 2021-04-22 DIAGNOSIS — G250 Essential tremor: Secondary | ICD-10-CM

## 2021-05-21 ENCOUNTER — Other Ambulatory Visit: Payer: Self-pay | Admitting: Family Medicine

## 2021-06-10 DIAGNOSIS — H524 Presbyopia: Secondary | ICD-10-CM | POA: Diagnosis not present

## 2021-06-10 DIAGNOSIS — H16223 Keratoconjunctivitis sicca, not specified as Sjogren's, bilateral: Secondary | ICD-10-CM | POA: Diagnosis not present

## 2021-06-11 ENCOUNTER — Other Ambulatory Visit: Payer: Self-pay | Admitting: Family Medicine

## 2021-06-11 DIAGNOSIS — E785 Hyperlipidemia, unspecified: Secondary | ICD-10-CM

## 2021-06-11 DIAGNOSIS — M899 Disorder of bone, unspecified: Secondary | ICD-10-CM

## 2021-06-11 DIAGNOSIS — G250 Essential tremor: Secondary | ICD-10-CM

## 2021-06-20 ENCOUNTER — Other Ambulatory Visit: Payer: Self-pay

## 2021-06-20 ENCOUNTER — Other Ambulatory Visit: Payer: Medicare PPO

## 2021-06-20 ENCOUNTER — Other Ambulatory Visit (INDEPENDENT_AMBULATORY_CARE_PROVIDER_SITE_OTHER): Payer: Medicare PPO

## 2021-06-20 DIAGNOSIS — M899 Disorder of bone, unspecified: Secondary | ICD-10-CM | POA: Diagnosis not present

## 2021-06-20 DIAGNOSIS — G250 Essential tremor: Secondary | ICD-10-CM

## 2021-06-20 DIAGNOSIS — E785 Hyperlipidemia, unspecified: Secondary | ICD-10-CM | POA: Diagnosis not present

## 2021-06-20 DIAGNOSIS — M949 Disorder of cartilage, unspecified: Secondary | ICD-10-CM

## 2021-06-20 LAB — COMPREHENSIVE METABOLIC PANEL
ALT: 12 U/L (ref 0–35)
AST: 16 U/L (ref 0–37)
Albumin: 4.1 g/dL (ref 3.5–5.2)
Alkaline Phosphatase: 70 U/L (ref 39–117)
BUN: 18 mg/dL (ref 6–23)
CO2: 30 mEq/L (ref 19–32)
Calcium: 9.1 mg/dL (ref 8.4–10.5)
Chloride: 104 mEq/L (ref 96–112)
Creatinine, Ser: 0.77 mg/dL (ref 0.40–1.20)
GFR: 74.43 mL/min (ref >60.00)
Glucose, Bld: 86 mg/dL (ref 70–99)
Potassium: 3.9 mEq/L (ref 3.5–5.1)
Sodium: 142 mEq/L (ref 135–145)
Total Bilirubin: 0.5 mg/dL (ref 0.2–1.2)
Total Protein: 6.4 g/dL (ref 6.0–8.3)

## 2021-06-20 LAB — LIPID PANEL
Cholesterol: 203 mg/dL — ABNORMAL HIGH (ref 0–200)
HDL: 68 mg/dL (ref >39.00)
LDL Cholesterol: 117 mg/dL — ABNORMAL HIGH (ref 0–99)
NonHDL: 135.36
Total CHOL/HDL Ratio: 3
Triglycerides: 90 mg/dL (ref 0.0–149.0)
VLDL: 18 mg/dL (ref 0.0–40.0)

## 2021-06-20 LAB — VITAMIN D 25 HYDROXY (VIT D DEFICIENCY, FRACTURES): VITD: 42.64 ng/mL (ref 30.00–100.00)

## 2021-06-20 LAB — TSH: TSH: 1.32 u[IU]/mL (ref 0.35–5.50)

## 2021-06-23 ENCOUNTER — Ambulatory Visit
Admission: RE | Admit: 2021-06-23 | Discharge: 2021-06-23 | Disposition: A | Discharge status: Home / Self Care | Payer: Medicare PPO | Source: Ambulatory Visit | Attending: Family Medicine | Admitting: Family Medicine

## 2021-06-23 ENCOUNTER — Other Ambulatory Visit: Payer: Self-pay

## 2021-06-23 DIAGNOSIS — Z1231 Encounter for screening mammogram for malignant neoplasm of breast: Secondary | ICD-10-CM | POA: Insufficient documentation

## 2021-06-27 ENCOUNTER — Encounter: Payer: Self-pay | Admitting: Family Medicine

## 2021-06-27 ENCOUNTER — Other Ambulatory Visit: Payer: Self-pay

## 2021-06-27 ENCOUNTER — Ambulatory Visit (INDEPENDENT_AMBULATORY_CARE_PROVIDER_SITE_OTHER): Payer: Medicare PPO | Admitting: Family Medicine

## 2021-06-27 VITALS — BP 126/80 | HR 62 | Temp 98.2°F | Ht 65.0 in | Wt 185.0 lb

## 2021-06-27 DIAGNOSIS — K219 Gastro-esophageal reflux disease without esophagitis: Secondary | ICD-10-CM | POA: Diagnosis not present

## 2021-06-27 DIAGNOSIS — Z7189 Other specified counseling: Secondary | ICD-10-CM

## 2021-06-27 DIAGNOSIS — M1990 Unspecified osteoarthritis, unspecified site: Secondary | ICD-10-CM

## 2021-06-27 DIAGNOSIS — G250 Essential tremor: Secondary | ICD-10-CM | POA: Diagnosis not present

## 2021-06-27 DIAGNOSIS — Z1283 Encounter for screening for malignant neoplasm of skin: Secondary | ICD-10-CM

## 2021-06-27 DIAGNOSIS — Z0000 Encounter for general adult medical examination without abnormal findings: Secondary | ICD-10-CM

## 2021-06-27 DIAGNOSIS — Z780 Asymptomatic menopausal state: Secondary | ICD-10-CM

## 2021-06-27 MED ORDER — OMEPRAZOLE 20 MG PO CPDR: 20.0000 mg | DELAYED_RELEASE_CAPSULE | Freq: Every day | ORAL | 3 refills | Status: DC

## 2021-06-27 MED ORDER — FLUTICASONE PROPIONATE 50 MCG/ACT NA SUSP: NASAL | 12 refills | Status: DC

## 2021-06-27 MED ORDER — ONDANSETRON HCL 4 MG PO TABS: 4.0000 mg | ORAL_TABLET | Freq: Three times a day (TID) | ORAL | 2 refills | Status: DC | PRN

## 2021-06-27 MED ORDER — PROPRANOLOL HCL 60 MG PO TABS: ORAL_TABLET | ORAL | 3 refills | Status: DC

## 2021-06-27 MED ORDER — MELOXICAM 15 MG PO TABS
7.5000 mg | ORAL_TABLET | Freq: Every day | ORAL | 3 refills | Status: DC
Start: 2021-06-27 — End: 2022-01-22

## 2021-06-27 NOTE — Progress Notes (Signed)
This visit occurred during the SARS-CoV-2 public health emergency.  Safety protocols were in place, including screening questions prior to the visit, additional usage of staff PPE, and extensive cleaning of exam room while observing appropriate contact time as indicated for disinfecting solutions.  I have personally reviewed the Medicare Annual Wellness questionnaire and have noted 1. The patient's medical and social history 2. Their use of alcohol, tobacco or illicit drugs 3. Their current medications and supplements 4. The patient's functional ability including ADL's, fall risks, home safety risks and hearing or visual             impairment. 5. Diet and physical activities 6. Evidence for depression or mood disorders  The patients weight, height, BMI have been recorded in the chart and visual acuity is per eye clinic.  I have made referrals, counseling and provided education to the patient based review of the above and I have provided the pt with a written personalized care plan for preventive services.  Provider list updated- see scanned forms.  Routine anticipatory guidance given to patient.  See health maintenance. The possibility exists that previously documented standard health maintenance information may have been brought forward from a previous encounter into this note.  If needed, that same information has been updated to reflect the current situation based on today's encounter.    Flu 2022 Shingles previously done PNA previously done Tetanus 2008, discussed with patient COVID-vaccine previously done Colonoscopy 2020 Breast cancer screening 2023 Bone density test pending 2023 Advance directive-daughter designated if patient were incapacitated. Cognitive function addressed- see scanned forms- and if abnormal then additional documentation follows.   In addition to [facility], follow up visit for the below conditions:  She wanted to see dermatology for a skin check, d/w  pt.  Referral placed.    She has noted occ BP elevations, up to SBP 150.  Controlled today.  Doesn't have h/o low BP. No CP, not SOB.  I presume that she does have hypertension that is usually controlled by her beta-blocker.  Discussed with patient.  Tremor.  Still with some symptoms, progressed from a few years ago.  No dysphagia. On propranolol at baseline.  She noted L>R hand sx.  Has seen neuro.  She noted handwriting changes.    GERD can cause nausea and sometimes needs zofran.  Still on PPI at baseline, usually 1 tab of prilosec a day, occ with 2nd dose if needed.    Anxiety. Very rare use, only used once.  Med list updated since not used.    Taking meloxicam prn for arthritis.  Trying not to use it daily, but only on days when she needs it.  Routine NSAID cautions d/w pt.  It helps her back pain.  We talked about taking a half tab daily, with full tab prn.    She has seen eye clinic about dry eye.  I'll defer.  She agrees.     PMH and SH reviewed  Meds, vitals, and allergies reviewed.   ROS: Per HPI.  Unless specifically indicated otherwise in HPI, the patient denies:  General: fever. Eyes: acute vision changes ENT: sore throat Cardiovascular: chest pain Respiratory: SOB GI: vomiting GU: dysuria Musculoskeletal: acute back pain Derm: acute rash Neuro: acute motor dysfunction Psych: worsening mood Endocrine: polydipsia Heme: bleeding Allergy: hayfever  GEN: nad, alert and oriented HEENT: ncat NECK: supple w/o LA CV: rrr. PULM: ctab, no inc wob ABD: soft, +bs EXT: no edema Tremor noted at baseline bilaterally arms. SKIN:  no acute rash

## 2021-06-27 NOTE — Patient Instructions (Addendum)
Let me know if you don't get a call from [facility] about scheduling a bone density test.  Take care.  Glad to see you.  Try taking meloxicam 1/2 tab daily with food.  You can take the other 1/2 on "bad" days or skip if not needed.    We'll call about seeing dermatology.

## 2021-06-30 DIAGNOSIS — H18451 Nodular corneal degeneration, right eye: Secondary | ICD-10-CM | POA: Diagnosis not present

## 2021-06-30 NOTE — Assessment & Plan Note (Signed)
Continue PPI.  She uses Zofran as needed.  She will update me as needed.

## 2021-06-30 NOTE — Assessment & Plan Note (Addendum)
Continue propranolol.  She noted handwriting changes but has been able to put up with her symptoms so far.  No dysphagia.  She will update me as needed.  She seen neurology previously.

## 2021-06-30 NOTE — Assessment & Plan Note (Signed)
Advance directive- daughter designated if patient were incapacitated.   

## 2021-06-30 NOTE — Assessment & Plan Note (Signed)
Taking meloxicam prn for arthritis.  Trying not to use it daily, but only on days when she needs it.  Routine NSAID cautions d/w pt.  It helps her back pain.  We talked about taking a half tab daily, with full tab prn.   She will update me as needed.

## 2021-06-30 NOTE — Assessment & Plan Note (Signed)
Flu 2022 Shingles previously done PNA previously done Tetanus 2008, discussed with patient COVID-vaccine previously done Colonoscopy 2020 Breast cancer screening 2023 Bone density test pending 2023 Advance directive-daughter designated if patient were incapacitated. Cognitive function addressed- see scanned forms- and if abnormal then additional documentation follows.

## 2021-07-24 ENCOUNTER — Encounter: Payer: Self-pay | Admitting: Family Medicine

## 2021-08-06 HISTORY — PX: EYE SURGERY: SHX253

## 2021-08-08 DIAGNOSIS — H18451 Nodular corneal degeneration, right eye: Secondary | ICD-10-CM | POA: Diagnosis not present

## 2021-08-17 ENCOUNTER — Other Ambulatory Visit: Payer: Self-pay | Admitting: Family Medicine

## 2021-08-21 DIAGNOSIS — M1711 Unilateral primary osteoarthritis, right knee: Secondary | ICD-10-CM | POA: Diagnosis not present

## 2021-09-18 DIAGNOSIS — M1711 Unilateral primary osteoarthritis, right knee: Secondary | ICD-10-CM | POA: Diagnosis not present

## 2021-09-25 DIAGNOSIS — M1711 Unilateral primary osteoarthritis, right knee: Secondary | ICD-10-CM | POA: Diagnosis not present

## 2021-09-29 DIAGNOSIS — M1711 Unilateral primary osteoarthritis, right knee: Secondary | ICD-10-CM | POA: Diagnosis not present

## 2021-09-29 DIAGNOSIS — S83241A Other tear of medial meniscus, current injury, right knee, initial encounter: Secondary | ICD-10-CM | POA: Diagnosis not present

## 2021-10-07 DIAGNOSIS — M7061 Trochanteric bursitis, right hip: Secondary | ICD-10-CM | POA: Diagnosis not present

## 2021-10-07 DIAGNOSIS — M7062 Trochanteric bursitis, left hip: Secondary | ICD-10-CM | POA: Diagnosis not present

## 2021-10-08 ENCOUNTER — Encounter: Payer: Self-pay | Admitting: Family Medicine

## 2021-10-20 DIAGNOSIS — M25561 Pain in right knee: Secondary | ICD-10-CM | POA: Diagnosis not present

## 2021-10-22 ENCOUNTER — Ambulatory Visit: Payer: Medicare PPO | Admitting: Neurology

## 2021-10-23 ENCOUNTER — Ambulatory Visit
Admission: RE | Admit: 2021-10-23 | Discharge: 2021-10-23 | Disposition: A | Discharge status: Home / Self Care | Payer: Medicare PPO | Source: Ambulatory Visit | Attending: Family Medicine | Admitting: Family Medicine

## 2021-10-23 DIAGNOSIS — Z780 Asymptomatic menopausal state: Secondary | ICD-10-CM | POA: Insufficient documentation

## 2021-10-24 DIAGNOSIS — M25561 Pain in right knee: Secondary | ICD-10-CM | POA: Diagnosis not present

## 2021-10-31 DIAGNOSIS — M25561 Pain in right knee: Secondary | ICD-10-CM | POA: Diagnosis not present

## 2021-11-07 NOTE — Progress Notes (Signed)
Assessment/Plan:    1.  Essential Tremor  -She will continue propranolol, 60 mg twice per day.  -She did not tolerate topiramate or higher dosages of primidone (and did not find lower dosages of either effective)  -She tried xanax but made too sleepy  -discussed surgical interventions again, both focused ultrasound and DBS.  She is not interested.  -discussed Cala Trio.  She may be interested in that.  Discussed cost implications and efficacy.  She thinks she would like to trial it.  Got her measurements today.  The left hand is much worse than the right and she thinks she would like to trial that first, even though she is right-hand dominant.  2.  Insomnia  -melatonin seemed to make tremor worse  -try trazodone, 25 mg - 50 mg at bed  3.  F/u 1 year.   Subjective:   Katie Hale was seen today in follow up for essential tremor.  My previous records were reviewed prior to todays visit.  Last visit, we discussed Cala Trio and patient was interested in trying that.  Measurements of her hand were obtained and she reports that she decided not to trial it.  Tremor is about the same.  Strangly, she notes that melatonin makes her tremor worse the morning after she takes it.  She only takes 1.5 mg at bed.     Current prescribed movement disorder medications: Propranolol, 60 mg twice per day Xanax, 0.25 mg rarely   PREVIOUS MEDICATIONS: Topamax (higher dosages caused dizziness and lower dosages ineffective); primidone (patient did not find it effective at 100 mg twice per day and had side effects with higher dosages); xanax (sleepy)  ALLERGIES:   Allergies  Allergen Reactions   Topamax [Topiramate] Other (See Comments)    "out of body experience."  Intolerant.      CURRENT MEDICATIONS:  Outpatient Encounter Medications as of 11/11/2021  Medication Sig   Calcium Carb-Cholecalciferol (CALCIUM + D3) 600-200 MG-UNIT TABS Take one tablet two times daily.   celecoxib (CELEBREX) 100 MG  capsule Take 100 mg by mouth 2 (two) times daily.   Cholecalciferol (VITAMIN D3) 2000 UNITS TABS Take 2,000 Units by mouth daily.   fluticasone (FLONASE) 50 MCG/ACT nasal spray SHAKE LIQUID AND USE 1 SPRAY IN EACH NOSTRIL DAILY   Lactobacillus (PROBIOTIC ACIDOPHILUS PO) Take 1 tablet by mouth daily.   Multiple Vitamin (MULTIVITAMIN) tablet Take 1 tablet by mouth daily.   omeprazole (PRILOSEC) 20 MG capsule TAKE 1 TO 2 CAPSULES(20 TO 40 MG) BY MOUTH DAILY   ondansetron (ZOFRAN) 4 MG tablet Take 1 tablet (4 mg total) by mouth every 8 (eight) hours as needed for nausea or vomiting.   propranolol (INDERAL) 60 MG tablet TAKE 1 TABLET(60 MG) BY MOUTH TWICE DAILY   Varenicline Tartrate (TYRVAYA) 0.03 MG/ACT SOLN Tyrvaya 0.03 mg/spray nasal spray   meloxicam (MOBIC) 15 MG tablet Take 0.5-1 tablets (7.5-15 mg total) by mouth daily. With food (Patient not taking: Reported on 11/11/2021)   No facility-administered encounter medications on file as of 11/11/2021.     Objective:    PHYSICAL EXAMINATION:    VITALS:   Vitals:   11/11/21 0901  BP: (!) 110/56  Pulse: (!) 56  SpO2: 96%  Weight: 179 lb 3.2 oz (81.3 kg)  Height: '5\' 2"'$  (1.575 m)      GEN:  The patient appears stated age and is in NAD. HEENT:  Normocephalic, atraumatic.  The mucous membranes are moist. The superficial temporal arteries  are without ropiness or tenderness. CV:  Loletha Grayer.  regular Lungs:  CTAB Neck/HEME:  There are no carotid bruits bilaterally.  Neurological examination:  Orientation: The patient is alert and oriented x3. Cranial nerves: There is good facial symmetry. The speech is fluent and clear. Soft palate rises symmetrically and there is no tongue deviation. Hearing is intact to conversational tone. Sensation: Sensation is intact to light touch throughout Motor: Strength is at least antigravity x4.  Movement examination: Tone: There is normal tone in the UE/LE Abnormal movements: rare LUE rest tremor.  There is  a postural tremor bilaterally, mod, L>R.  She has chin tremor.  This is same as previous. Coordination:  There is no decremation with RAM's, with any form of RAMS, including alternating supination and pronation of the forearm, hand opening and closing, finger taps, heel taps and toe taps bilaterally. Gait and station:  normal stride length with good arm swing bilaterally  I have reviewed and interpreted the following labs independently   Chemistry      Component Value Date/Time   NA 142 06/20/2021 0912   K 3.9 06/20/2021 0912   CL 104 06/20/2021 0912   CO2 30 06/20/2021 0912   BUN 18 06/20/2021 0912   CREATININE 0.77 06/20/2021 0912      Component Value Date/Time   CALCIUM 9.1 06/20/2021 0912   ALKPHOS 70 06/20/2021 0912   AST 16 06/20/2021 0912   AST 17 04/11/2013 0000   ALT 12 06/20/2021 0912   BILITOT 0.5 06/20/2021 0912      No results found for: WBC, HGB, HCT, MCV, PLT Lab Results  Component Value Date   TSH 1.32 06/20/2021     Chemistry      Component Value Date/Time   NA 142 06/20/2021 0912   K 3.9 06/20/2021 0912   CL 104 06/20/2021 0912   CO2 30 06/20/2021 0912   BUN 18 06/20/2021 0912   CREATININE 0.77 06/20/2021 0912      Component Value Date/Time   CALCIUM 9.1 06/20/2021 0912   ALKPHOS 70 06/20/2021 0912   AST 16 06/20/2021 0912   AST 17 04/11/2013 0000   ALT 12 06/20/2021 0912   BILITOT 0.5 06/20/2021 0912         Total time spent on today's visit was 20 minutes, including both face-to-face time and nonface-to-face time.  Time included that spent on review of records (prior notes available to me/labs/imaging if pertinent), discussing treatment and goals, answering patient's questions and coordinating care.  Cc:  Tonia Ghent, MD

## 2021-11-11 ENCOUNTER — Encounter: Payer: Self-pay | Admitting: Neurology

## 2021-11-11 ENCOUNTER — Ambulatory Visit: Payer: Medicare PPO | Admitting: Neurology

## 2021-11-11 VITALS — BP 110/56 | HR 56 | Ht 62.0 in | Wt 179.2 lb

## 2021-11-11 DIAGNOSIS — G250 Essential tremor: Secondary | ICD-10-CM

## 2021-11-11 DIAGNOSIS — G4700 Insomnia, unspecified: Secondary | ICD-10-CM

## 2021-11-11 MED ORDER — TRAZODONE HCL 50 MG PO TABS: ORAL_TABLET | ORAL | 1 refills | Status: DC

## 2021-11-13 DIAGNOSIS — M1711 Unilateral primary osteoarthritis, right knee: Secondary | ICD-10-CM | POA: Diagnosis not present

## 2021-11-24 DIAGNOSIS — M1711 Unilateral primary osteoarthritis, right knee: Secondary | ICD-10-CM | POA: Diagnosis not present

## 2021-12-01 DIAGNOSIS — M1711 Unilateral primary osteoarthritis, right knee: Secondary | ICD-10-CM | POA: Diagnosis not present

## 2021-12-08 DIAGNOSIS — M1711 Unilateral primary osteoarthritis, right knee: Secondary | ICD-10-CM | POA: Diagnosis not present

## 2021-12-17 ENCOUNTER — Ambulatory Visit (INDEPENDENT_AMBULATORY_CARE_PROVIDER_SITE_OTHER): Payer: Medicare PPO | Admitting: Dermatology

## 2021-12-17 DIAGNOSIS — D229 Melanocytic nevi, unspecified: Secondary | ICD-10-CM | POA: Diagnosis not present

## 2021-12-17 DIAGNOSIS — D1800 Hemangioma unspecified site: Secondary | ICD-10-CM

## 2021-12-17 DIAGNOSIS — L720 Epidermal cyst: Secondary | ICD-10-CM

## 2021-12-17 DIAGNOSIS — Z1283 Encounter for screening for malignant neoplasm of skin: Secondary | ICD-10-CM | POA: Diagnosis not present

## 2021-12-17 DIAGNOSIS — I8393 Asymptomatic varicose veins of bilateral lower extremities: Secondary | ICD-10-CM

## 2021-12-17 DIAGNOSIS — L578 Other skin changes due to chronic exposure to nonionizing radiation: Secondary | ICD-10-CM | POA: Diagnosis not present

## 2021-12-17 DIAGNOSIS — L821 Other seborrheic keratosis: Secondary | ICD-10-CM

## 2021-12-17 DIAGNOSIS — L814 Other melanin hyperpigmentation: Secondary | ICD-10-CM

## 2021-12-17 DIAGNOSIS — L918 Other hypertrophic disorders of the skin: Secondary | ICD-10-CM

## 2021-12-17 NOTE — Progress Notes (Signed)
New Patient Visit  Subjective  Katie Hale is a 76 y.o. female who presents for the following: Total body skin exam (No hx of skin ca, parents with hx of BCC).  New patient referral from Dr. Elsie Stain.  The patient presents for Total-Body Skin Exam (TBSE) for skin cancer screening and mole check.  The patient has spots, moles and lesions to be evaluated, some may be new or changing and the patient has concerns that these could be cancer.   The following portions of the chart were reviewed this encounter and updated as appropriate:   Tobacco  Allergies  Meds  Problems  Med Hx  Surg Hx  Fam Hx     Review of Systems:  No other skin or systemic complaints except as noted in HPI or Assessment and Plan.  Objective  Well appearing patient in no apparent distress; mood and affect are within normal limits.  A full examination was performed including scalp, head, eyes, ears, nose, lips, neck, chest, axillae, abdomen, back, buttocks, bilateral upper extremities, bilateral lower extremities, hands, feet, fingers, toes, fingernails, and toenails. All findings within normal limits unless otherwise noted below.  R lat waistline Cystic pap   Assessment & Plan   Lentigines - Scattered tan macules - Due to sun exposure - Benign-appearing, observe - Recommend daily broad spectrum sunscreen SPF 30+ to sun-exposed areas, reapply every 2 hours as needed. - Call for any changes  Seborrheic Keratoses - Stuck-on, waxy, tan-brown papules and/or plaques  - Benign-appearing - Discussed benign etiology and prognosis. - Observe - Call for any changes - trunk, arms, groin  Melanocytic Nevi - Tan-brown and/or pink-flesh-colored symmetric macules and papules - Benign appearing on exam today - Observation - Call clinic for new or changing moles - Recommend daily use of broad spectrum spf 30+ sunscreen to sun-exposed areas.  - trunk, face  Hemangiomas - Red papules - Discussed benign  nature - Observe - Call for any changes - trunk  Actinic Damage - Chronic condition, secondary to cumulative UV/sun exposure - diffuse scaly erythematous macules with underlying dyspigmentation - Recommend daily broad spectrum sunscreen SPF 30+ to sun-exposed areas, reapply every 2 hours as needed.  - Staying in the shade or wearing long sleeves, sun glasses (UVA+UVB protection) and wide brim hats (4-inch brim around the entire circumference of the hat) are also recommended for sun protection.  - Call for new or changing lesions.  Skin cancer screening performed today.  Acrochordons (Skin Tags) - Fleshy, skin-colored pedunculated papules - Benign appearing.  - Observe. - If desired, they can be removed with an in office procedure that is not covered by insurance. - Please call the clinic if you notice any new or changing lesions.  - inframammary  Epidermal cyst R lat waistline  Benign, observe.    Actinic skin damage  Skin cancer screening  Varicose Veins/Spider Veins - Dilated blue, purple or red veins at the lower extremities - Reassured - Smaller vessels can be treated by sclerotherapy (a procedure to inject a medicine into the veins to make them disappear) if desired, but the treatment is not covered by insurance. Larger vessels may be covered if symptomatic and we would refer to vascular surgeon if treatment desired.  - legs  Return for 2 yr TBSE.  I, Othelia Pulling, RMA, am acting as scribe for Sarina Ser, MD . Documentation: I have reviewed the above documentation for accuracy and completeness, and I agree with the above.  Sarina Ser, MD

## 2021-12-17 NOTE — Patient Instructions (Signed)
Due to recent changes in healthcare laws, you may see results of your pathology and/or laboratory studies on MyChart before the doctors have had a chance to review them. We understand that in some cases there may be results that are confusing or concerning to you. Please understand that not all results are received at the same time and often the doctors may need to interpret multiple results in order to provide you with the best plan of care or course of treatment. Therefore, we ask that you please give us 2 business days to thoroughly review all your results before contacting the office for clarification. Should we see a critical lab result, you will be contacted sooner.   If You Need Anything After Your Visit  If you have any questions or concerns for your doctor, please call our main line at [phone] and press option 4 to reach your doctor's medical assistant. If no one answers, please leave a voicemail as directed and we will return your call as soon as possible. Messages left after 4 pm will be answered the following business day.   You may also send us a message via MyChart. We typically respond to MyChart messages within 1-2 business days.  For prescription refills, please ask your pharmacy to contact our office. Our fax number is [phone].  If you have an urgent issue when the clinic is closed that cannot wait until the next business day, you can page your doctor at the number below.    Please note that while we do our best to be available for urgent issues outside of office hours, we are not available 24/7.   If you have an urgent issue and are unable to reach us, you may choose to seek medical care at your doctor's office, retail clinic, urgent care center, or emergency room.  If you have a medical emergency, please immediately call 911 or go to the emergency department.  Pager Numbers  - Dr. Kowalski: [phone]  - Dr. Moye: [phone]  - Dr. Stewart:  [phone]  In the event of inclement weather, please call our main line at [phone] for an update on the status of any delays or closures.  Dermatology Medication Tips: Please keep the boxes that topical medications come in in order to help keep track of the instructions about where and how to use these. Pharmacies typically print the medication instructions only on the boxes and not directly on the medication tubes.   If your medication is too expensive, please contact our office at [phone] option 4 or send us a message through MyChart.   We are unable to tell what your co-pay for medications will be in advance as this is different depending on your insurance coverage. However, we may be able to find a substitute medication at lower cost or fill out paperwork to get insurance to cover a needed medication.   If a prior authorization is required to get your medication covered by your insurance company, please allow us 1-2 business days to complete this process.  Drug prices often vary depending on where the prescription is filled and some pharmacies may offer cheaper prices.  The website www.goodrx.com contains coupons for medications through different pharmacies. The prices here do not account for what the cost may be with help from insurance (it may be cheaper with your insurance), but the website can give you the price if you did not use any insurance.  - You can print the associated coupon and take it with   your prescription to the pharmacy.  - You may also stop by our office during regular business hours and pick up a GoodRx coupon card.  - If you need your prescription sent electronically to a different pharmacy, notify our office through [facility] [facility] MyChart or by phone at [phone] option 4.     Si Usted Necesita Algo Despus de Su Visita  Tambin puede enviarnos un mensaje a travs de MyChart. Por lo general respondemos a los mensajes de MyChart en el transcurso de 1 a 2  das hbiles.  Para renovar recetas, por favor pida a su farmacia que se ponga en contacto con nuestra oficina. Nuestro nmero de fax es el [phone].  Si tiene un asunto urgente cuando la clnica est cerrada y que no puede esperar hasta el siguiente da hbil, puede llamar/localizar a su doctor(a) al nmero que aparece a continuacin.   Por favor, tenga en cuenta que aunque hacemos todo lo posible para estar disponibles para asuntos urgentes fuera del horario de oficina, no estamos disponibles las 24 horas del da, los 7 das de la semana.   Si tiene un problema urgente y no puede comunicarse con nosotros, puede optar por buscar atencin mdica  en el consultorio de su doctor(a), en una clnica privada, en un centro de atencin urgente o en una sala de emergencias.  Si tiene una emergencia mdica, por favor llame inmediatamente al 911 o vaya a la sala de emergencias.  Nmeros de bper  - Dr. Kowalski: [phone]  - Dra. Moye: [phone]  - Dra. Stewart: [phone]  En caso de inclemencias del tiempo, por favor llame a nuestra lnea principal al [phone] para una actualizacin sobre el estado de cualquier retraso o cierre.  Consejos para la medicacin en dermatologa: Por favor, guarde las cajas en las que vienen los medicamentos de uso tpico para ayudarle a seguir las instrucciones sobre dnde y cmo usarlos. Las farmacias generalmente imprimen las instrucciones del medicamento slo en las cajas y no directamente en los tubos del medicamento.   Si su medicamento es muy caro, por favor, pngase en contacto con nuestra oficina llamando al [phone] y presione la opcin 4 o envenos un mensaje a travs de MyChart.   No podemos decirle cul ser su copago por los medicamentos por adelantado ya que esto es diferente dependiendo de la cobertura de su seguro. Sin embargo, es posible que podamos encontrar un medicamento sustituto a menor costo o llenar un formulario para que el  seguro cubra el medicamento que se considera necesario.   Si se requiere una autorizacin previa para que su compaa de seguros cubra su medicamento, por favor permtanos de 1 a 2 das hbiles para completar este proceso.  Los precios de los medicamentos varan con frecuencia dependiendo del lugar de dnde se surte la receta y alguna farmacias pueden ofrecer precios ms baratos.  El sitio web www.goodrx.com tiene cupones para medicamentos de diferentes farmacias. Los precios aqu no tienen en cuenta lo que podra costar con la ayuda del seguro (puede ser ms barato con su seguro), pero el sitio web puede darle el precio si no utiliz ningn seguro.  - Puede imprimir el cupn correspondiente y llevarlo con su receta a la farmacia.  - Tambin puede pasar por nuestra oficina durante el horario de atencin regular y recoger una tarjeta de cupones de GoodRx.  - Si necesita que su receta se enve electrnicamente a una farmacia diferente, informe a nuestra oficina a travs de MyChart de [facility]   o por telfono llamando al [phone] y presione la opcin 4.  

## 2021-12-27 ENCOUNTER — Encounter: Payer: Self-pay | Admitting: Dermatology

## 2022-01-12 DIAGNOSIS — S83241A Other tear of medial meniscus, current injury, right knee, initial encounter: Secondary | ICD-10-CM | POA: Diagnosis not present

## 2022-01-21 DIAGNOSIS — Z01818 Encounter for other preprocedural examination: Secondary | ICD-10-CM | POA: Diagnosis not present

## 2022-01-21 DIAGNOSIS — M25561 Pain in right knee: Secondary | ICD-10-CM | POA: Diagnosis not present

## 2022-01-22 ENCOUNTER — Encounter
Admission: RE | Admit: 2022-01-22 | Discharge: 2022-01-22 | Disposition: A | Discharge status: Home / Self Care | Payer: Medicare PPO | Source: Ambulatory Visit | Attending: Orthopaedic Surgery | Admitting: Orthopaedic Surgery

## 2022-01-22 DIAGNOSIS — Z01818 Encounter for other preprocedural examination: Secondary | ICD-10-CM

## 2022-01-22 DIAGNOSIS — I498 Other specified cardiac arrhythmias: Secondary | ICD-10-CM

## 2022-01-22 DIAGNOSIS — I10 Essential (primary) hypertension: Secondary | ICD-10-CM

## 2022-01-22 DIAGNOSIS — Z01812 Encounter for preprocedural laboratory examination: Secondary | ICD-10-CM

## 2022-01-22 HISTORY — DX: Essential (primary) hypertension: I10

## 2022-01-22 HISTORY — DX: Nausea with vomiting, unspecified: R11.2

## 2022-01-22 HISTORY — DX: Dizziness and giddiness: R42

## 2022-01-22 HISTORY — DX: Other specified cardiac arrhythmias: I49.8

## 2022-01-22 HISTORY — DX: Nausea with vomiting, unspecified: Z98.890

## 2022-01-22 HISTORY — DX: Dermatitis, unspecified: L30.9

## 2022-01-22 HISTORY — DX: Dry eye syndrome of unspecified lacrimal gland: H04.129

## 2022-01-22 HISTORY — DX: Insomnia, unspecified: G47.00

## 2022-01-22 HISTORY — DX: Essential tremor: G25.0

## 2022-01-22 HISTORY — DX: Plantar fascial fibromatosis: M72.2

## 2022-01-22 NOTE — Patient Instructions (Addendum)
Your procedure is scheduled on:01-29-22 Thursday Report to the Registration Desk on the 1st floor of the [facility].Then proceed to the 2nd floor Surgery Desk To find out your arrival time, please call [phone] between 1PM - 3PM on:01-28-22 Wednesday If your arrival time is 6:00 am, do not arrive prior to that time as the [facility] entrance doors do not open until 6:00 am.  REMEMBER: Instructions that are not followed completely may result in serious medical risk, up to and including death; or upon the discretion of your surgeon and anesthesiologist your surgery may need to be rescheduled.  Do not eat food after midnight the night before surgery.  No gum chewing, lozengers or hard candies.  You may however, drink CLEAR liquids up to 2 hours before you are scheduled to arrive for your surgery. Do not drink anything within 2 hours of your scheduled arrival time.  Clear liquids include: - water  - apple juice without pulp - gatorade (not RED colors) - black coffee or tea (Do NOT add milk or creamers to the coffee or tea) Do NOT drink anything that is not on this list.  In addition, your doctor has ordered for you to drink the provided  Ensure Pre-Surgery Clear Carbohydrate Drink  Drinking this carbohydrate drink up to two hours before surgery helps to reduce insulin resistance and improve patient outcomes. Please complete drinking 2 hours prior to scheduled arrival time.  TAKE THESE MEDICATIONS THE MORNING OF SURGERY WITH A SIP OF WATER: -propranolol (INDERAL) -omeprazole (PRILOSEC)-take one the night before and one on the morning of surgery - helps to prevent nausea after surgery.)  One week prior to surgery: Stop Anti-inflammatories (NSAIDS) such as Advil, Aleve, Ibuprofen, Motrin, Naproxen, Naprosyn and Aspirin based products such as Excedrin, Goodys Powder, BC Powder.You may however, continue to take Tylenol if needed for pain up until the day of surgery. You may continue your  celecoxib (CELEBREX) up until the day prior to surgery  Stop [place] supplements/vitamins NOW (01-22-22) until after surgery (Calcium, Vitamin D, Probiotic and multivitamin)  No Alcohol for 24 hours before or after surgery.  No Smoking including e-cigarettes for 24 hours prior to surgery.  No chewable tobacco products for at least 6 hours prior to surgery.  No nicotine patches on the day of surgery.  Do not use any "recreational" drugs for at least a week prior to your surgery.  Please be advised that the combination of cocaine and anesthesia may have negative outcomes, up to and including death. If you test positive for cocaine, your surgery will be cancelled.  On the morning of surgery brush your teeth with toothpaste and water, you may rinse your mouth with mouthwash if you wish. Do not swallow any toothpaste or mouthwash.  Use CHG Soap as directed on instruction sheet.  Do not wear jewelry, make-up, hairpins, clips or nail polish.  Do not wear lotions, powders, or perfumes.   Do not shave body from the neck down 48 hours prior to surgery just in case you cut yourself which could leave a site for infection.  Also, freshly shaved skin may become irritated if using the CHG soap.  Contact lenses, hearing aids and dentures may not be worn into surgery.  Do not bring valuables to the hospital. [facility] is not responsible for any missing/lost belongings or valuables.    Notify your doctor if there is any change in your medical condition (cold, fever, infection).  Wear comfortable clothing (  specific to your surgery type) to the hospital.  After surgery, you can help prevent lung complications by doing breathing exercises.  Take deep breaths and cough every 1-2 hours. Your doctor may order a device called an Incentive Spirometer to help you take deep breaths. When coughing or sneezing, hold a pillow firmly against your incision with both hands. This is called  "splinting." Doing this helps protect your incision. It also decreases belly discomfort.  If you are being admitted to the hospital overnight, leave your suitcase in the car. After surgery it may be brought to your room.  If you are being discharged the day of surgery, you will not be allowed to drive home. You will need a responsible adult (18 years or older) to drive you home and stay with you that night.   If you are taking public transportation, you will need to have a responsible adult (18 years or older) with you. Please confirm with your physician that it is acceptable to use public transportation.   Please call the [facility]. at [phone] if you have any questions about these instructions.  Surgery Visitation Policy:  Patients undergoing a surgery or procedure may have two family members or support persons with them as long as the person is not COVID-19 positive or experiencing its symptoms.    How to Use an Incentive Spirometer An incentive spirometer is a tool that measures how well you are filling your lungs with each breath. Learning to take long, deep breaths using this tool can help you keep your lungs clear and active. This may help to reverse or lessen your chance of developing breathing (pulmonary) problems, especially infection. You may be asked to use a spirometer: After a surgery. If you have a lung problem or a history of smoking. After a long period of time when you have been unable to move or be active. If the spirometer includes an indicator to show the highest number that you have reached, your health care provider or respiratory therapist will help you set a goal. Keep a log of your progress as told by your health care provider. What are the risks? Breathing too quickly may cause dizziness or cause you to pass out. Take your time so you do not get dizzy or light-headed. If you are in pain, you may need to take pain medicine before doing  incentive spirometry. It is harder to take a deep breath if you are having pain. How to use your incentive spirometer  Sit up on the edge of your bed or on a chair. Hold the incentive spirometer so that it is in an upright position. Before you use the spirometer, breathe out normally. Place the mouthpiece in your mouth. Make sure your lips are closed tightly around it. Breathe in slowly and as deeply as you can through your mouth, causing the piston or the ball to rise toward the top of the chamber. Hold your breath for 3-5 seconds, or for as long as possible. If the spirometer includes a coach indicator, use this to guide you in breathing. Slow down your breathing if the indicator goes above the marked areas. Remove the mouthpiece from your mouth and breathe out normally. The piston or ball will return to the bottom of the chamber. Rest for a few seconds, then repeat the steps 10 or more times. Take your time and take a few normal breaths between deep breaths so that you do not get dizzy or light-headed. Do this  every 1-2 hours when you are awake. If the spirometer includes a goal marker to show the highest number you have reached (best effort), use this as a goal to work toward during each repetition. After each set of 10 deep breaths, cough a few times. This will help to make sure that your lungs are clear. If you have an incision on your chest or abdomen from surgery, place a pillow or a rolled-up towel firmly against the incision when you cough. This can help to reduce pain while taking deep breaths and coughing. General tips When you are able to get out of bed: Walk around often. Continue to take deep breaths and cough in order to clear your lungs. Keep using the incentive spirometer until your health care provider says it is okay to stop using it. If you have been in the hospital, you may be told to keep using the spirometer at home. Contact a health care provider if: You are having  difficulty using the spirometer. You have trouble using the spirometer as often as instructed. Your pain medicine is not giving enough relief for you to use the spirometer as told. You have a fever. Get help right away if: You develop shortness of breath. You develop a cough with bloody mucus from the lungs. You have fluid or blood coming from an incision site after you cough. Summary An incentive spirometer is a tool that can help you learn to take long, deep breaths to keep your lungs clear and active. You may be asked to use a spirometer after a surgery, if you have a lung problem or a history of smoking, or if you have been inactive for a long period of time. Use your incentive spirometer as instructed every 1-2 hours while you are awake. If you have an incision on your chest or abdomen, place a pillow or a rolled-up towel firmly against your incision when you cough. This will help to reduce pain. Get help right away if you have shortness of breath, you cough up bloody mucus, or blood comes from your incision when you cough. This information is not intended to replace advice given to you by your health care provider. Make sure you discuss any questions you have with your health care provider. Document Revised: 08/14/2019 Document Reviewed: 08/14/2019 Elsevier Patient Education  [place].

## 2022-01-23 ENCOUNTER — Encounter: Payer: Self-pay | Admitting: Urgent Care

## 2022-01-23 ENCOUNTER — Encounter
Admission: RE | Admit: 2022-01-23 | Discharge: 2022-01-23 | Disposition: A | Discharge status: Home / Self Care | Payer: Medicare PPO | Source: Ambulatory Visit | Attending: Orthopaedic Surgery | Admitting: Orthopaedic Surgery

## 2022-01-23 DIAGNOSIS — Z01818 Encounter for other preprocedural examination: Secondary | ICD-10-CM | POA: Diagnosis not present

## 2022-01-23 DIAGNOSIS — I498 Other specified cardiac arrhythmias: Secondary | ICD-10-CM | POA: Insufficient documentation

## 2022-01-23 DIAGNOSIS — I10 Essential (primary) hypertension: Secondary | ICD-10-CM | POA: Insufficient documentation

## 2022-01-23 DIAGNOSIS — Z01812 Encounter for preprocedural laboratory examination: Secondary | ICD-10-CM

## 2022-01-23 LAB — CBC
HCT: 41.2 % (ref 36.0–46.0)
Hemoglobin: 13.6 g/dL (ref 12.0–15.0)
MCH: 31.5 pg (ref 26.0–34.0)
MCHC: 33 g/dL (ref 30.0–36.0)
MCV: 95.4 fL (ref 80.0–100.0)
Platelets: 261 10*3/uL (ref 150–400)
RBC: 4.32 MIL/uL (ref 3.87–5.11)
RDW: 12.5 % (ref 11.5–15.5)
WBC: 6.3 10*3/uL (ref 4.0–10.5)
nRBC: 0 % (ref 0.0–0.2)

## 2022-01-23 LAB — BASIC METABOLIC PANEL
Anion gap: 12 (ref 5–15)
BUN: 16 mg/dL (ref 8–23)
CO2: 26 mmol/L (ref 22–32)
Calcium: 9.2 mg/dL (ref 8.9–10.3)
Chloride: 104 mmol/L (ref 98–111)
Creatinine, Ser: 0.74 mg/dL (ref 0.44–1.00)
GFR, Estimated: >60 mL/min (ref >60)
Glucose, Bld: 85 mg/dL (ref 70–99)
Potassium: 3.6 mmol/L (ref 3.5–5.1)
Sodium: 142 mmol/L (ref 135–145)

## 2022-01-29 ENCOUNTER — Other Ambulatory Visit: Payer: Self-pay

## 2022-01-29 ENCOUNTER — Ambulatory Visit
Admission: RE | Admit: 2022-01-29 | Discharge: 2022-01-29 | Disposition: A | Discharge status: Home / Self Care | Payer: Medicare PPO | Attending: Orthopaedic Surgery | Admitting: Orthopaedic Surgery

## 2022-01-29 ENCOUNTER — Encounter: Payer: Self-pay | Admitting: Orthopaedic Surgery

## 2022-01-29 ENCOUNTER — Ambulatory Visit: Payer: Medicare PPO | Admitting: Certified Registered Nurse Anesthetist

## 2022-01-29 ENCOUNTER — Encounter
Admission: RE | Disposition: A | Discharge status: Home / Self Care | Payer: Self-pay | Source: Home / Self Care | Attending: Orthopaedic Surgery

## 2022-01-29 ENCOUNTER — Ambulatory Visit: Payer: Medicare PPO | Admitting: Urgent Care

## 2022-01-29 DIAGNOSIS — X58XXXA Exposure to other specified factors, initial encounter: Secondary | ICD-10-CM | POA: Insufficient documentation

## 2022-01-29 DIAGNOSIS — H04129 Dry eye syndrome of unspecified lacrimal gland: Secondary | ICD-10-CM | POA: Diagnosis not present

## 2022-01-29 DIAGNOSIS — I10 Essential (primary) hypertension: Secondary | ICD-10-CM | POA: Insufficient documentation

## 2022-01-29 DIAGNOSIS — S83231A Complex tear of medial meniscus, current injury, right knee, initial encounter: Secondary | ICD-10-CM | POA: Insufficient documentation

## 2022-01-29 DIAGNOSIS — M1990 Unspecified osteoarthritis, unspecified site: Secondary | ICD-10-CM | POA: Insufficient documentation

## 2022-01-29 DIAGNOSIS — K219 Gastro-esophageal reflux disease without esophagitis: Secondary | ICD-10-CM | POA: Diagnosis not present

## 2022-01-29 DIAGNOSIS — M2241 Chondromalacia patellae, right knee: Secondary | ICD-10-CM | POA: Diagnosis not present

## 2022-01-29 DIAGNOSIS — M94261 Chondromalacia, right knee: Secondary | ICD-10-CM | POA: Insufficient documentation

## 2022-01-29 DIAGNOSIS — G250 Essential tremor: Secondary | ICD-10-CM | POA: Insufficient documentation

## 2022-01-29 DIAGNOSIS — S83241A Other tear of medial meniscus, current injury, right knee, initial encounter: Secondary | ICD-10-CM | POA: Diagnosis not present

## 2022-01-29 HISTORY — PX: KNEE ARTHROSCOPY WITH MEDIAL MENISECTOMY: SHX5651

## 2022-01-29 SURGERY — ARTHROSCOPY, KNEE, WITH MEDIAL MENISCECTOMY
Anesthesia: General | Site: Knee | Laterality: Right

## 2022-01-29 MED ORDER — KETOROLAC TROMETHAMINE 30 MG/ML IJ SOLN
INTRAMUSCULAR | Status: AC
Start: 2022-01-29 — End: ?
  Filled 2022-01-29: qty 1

## 2022-01-29 MED ORDER — FENTANYL CITRATE (PF) 100 MCG/2ML IJ SOLN
INTRAMUSCULAR | Status: DC | PRN
  Administered 2022-01-29 (×4): 25 ug via INTRAVENOUS

## 2022-01-29 MED ORDER — EPHEDRINE SULFATE (PRESSORS) 50 MG/ML IJ SOLN
INTRAMUSCULAR | Status: DC | PRN
  Administered 2022-01-29: 10 mg via INTRAVENOUS

## 2022-01-29 MED ORDER — CHLORHEXIDINE GLUCONATE 0.12 % MT SOLN
OROMUCOSAL | Status: AC
  Administered 2022-01-29: 15 mL via OROMUCOSAL
  Filled 2022-01-29: qty 15

## 2022-01-29 MED ORDER — GABAPENTIN 300 MG PO CAPS
ORAL_CAPSULE | ORAL | Status: AC
  Administered 2022-01-29: 300 mg via ORAL
  Filled 2022-01-29: qty 1

## 2022-01-29 MED ORDER — LIDOCAINE HCL (PF) 2 % IJ SOLN
INTRAMUSCULAR | Status: AC
Start: 2022-01-29 — End: ?
  Filled 2022-01-29: qty 5

## 2022-01-29 MED ORDER — LACTATED RINGERS IR SOLN: Status: DC | PRN

## 2022-01-29 MED ORDER — CEFAZOLIN SODIUM-DEXTROSE 2-4 GM/100ML-% IV SOLN
INTRAVENOUS | Status: AC
  Filled 2022-01-29: qty 100

## 2022-01-29 MED ORDER — ACETAMINOPHEN 500 MG PO TABS
ORAL_TABLET | ORAL | Status: AC
  Filled 2022-01-29: qty 2

## 2022-01-29 MED ORDER — CHLORHEXIDINE GLUCONATE 0.12 % MT SOLN: 15.0000 mL | Freq: Once | OROMUCOSAL | Status: AC

## 2022-01-29 MED ORDER — ONDANSETRON HCL 4 MG/2ML IJ SOLN
INTRAMUSCULAR | Status: AC
Start: 2022-01-29 — End: ?
  Filled 2022-01-29: qty 2

## 2022-01-29 MED ORDER — CEFAZOLIN SODIUM-DEXTROSE 2-4 GM/100ML-% IV SOLN
2.0000 g | INTRAVENOUS | Status: AC
  Administered 2022-01-29: 2 g via INTRAVENOUS

## 2022-01-29 MED ORDER — FENTANYL CITRATE (PF) 100 MCG/2ML IJ SOLN
INTRAMUSCULAR | Status: AC
  Filled 2022-01-29: qty 2

## 2022-01-29 MED ORDER — KETOROLAC TROMETHAMINE 30 MG/ML IJ SOLN
INTRAMUSCULAR | Status: DC | PRN
  Administered 2022-01-29: 30 mg via INTRAVENOUS

## 2022-01-29 MED ORDER — OXYCODONE HCL 5 MG PO TABS
5.0000 mg | ORAL_TABLET | Freq: Once | ORAL | Status: AC | PRN
  Administered 2022-01-29: 5 mg via ORAL

## 2022-01-29 MED ORDER — DEXAMETHASONE SODIUM PHOSPHATE 10 MG/ML IJ SOLN
INTRAMUSCULAR | Status: DC | PRN
  Administered 2022-01-29: 10 mg via INTRAVENOUS

## 2022-01-29 MED ORDER — LIDOCAINE HCL (CARDIAC) PF 100 MG/5ML IV SOSY
PREFILLED_SYRINGE | INTRAVENOUS | Status: DC | PRN
  Administered 2022-01-29: 100 mg via INTRAVENOUS

## 2022-01-29 MED ORDER — OXYCODONE HCL 5 MG PO TABS
ORAL_TABLET | ORAL | Status: AC
  Filled 2022-01-29: qty 1

## 2022-01-29 MED ORDER — GABAPENTIN 300 MG PO CAPS: 300.0000 mg | ORAL_CAPSULE | Freq: Once | ORAL | Status: AC

## 2022-01-29 MED ORDER — ORAL CARE MOUTH RINSE: 15.0000 mL | Freq: Once | OROMUCOSAL | Status: AC

## 2022-01-29 MED ORDER — PROPOFOL 10 MG/ML IV BOLUS
INTRAVENOUS | Status: AC
  Filled 2022-01-29: qty 20

## 2022-01-29 MED ORDER — ACETAMINOPHEN 500 MG PO TABS
1000.0000 mg | ORAL_TABLET | Freq: Once | ORAL | Status: AC
  Administered 2022-01-29: 1000 mg via ORAL

## 2022-01-29 MED ORDER — BUPIVACAINE-EPINEPHRINE (PF) 0.25% -1:200000 IJ SOLN
INTRAMUSCULAR | Status: AC
  Filled 2022-01-29: qty 30

## 2022-01-29 MED ORDER — LACTATED RINGERS IV SOLN: INTRAVENOUS | Status: DC

## 2022-01-29 MED ORDER — FENTANYL CITRATE (PF) 100 MCG/2ML IJ SOLN
INTRAMUSCULAR | Status: AC
  Administered 2022-01-29: 50 ug via INTRAVENOUS
  Filled 2022-01-29: qty 2

## 2022-01-29 MED ORDER — FENTANYL CITRATE (PF) 100 MCG/2ML IJ SOLN
25.0000 ug | INTRAMUSCULAR | Status: DC | PRN
  Administered 2022-01-29 (×2): 25 ug via INTRAVENOUS

## 2022-01-29 MED ORDER — GLYCOPYRROLATE 0.2 MG/ML IJ SOLN
INTRAMUSCULAR | Status: DC | PRN
  Administered 2022-01-29: .2 mg via INTRAVENOUS

## 2022-01-29 MED ORDER — OXYCODONE HCL 5 MG/5ML PO SOLN: 5.0000 mg | Freq: Once | ORAL | Status: AC | PRN

## 2022-01-29 MED ORDER — FAMOTIDINE 20 MG PO TABS
ORAL_TABLET | ORAL | Status: AC
  Administered 2022-01-29: 20 mg
  Filled 2022-01-29: qty 1

## 2022-01-29 MED ORDER — ONDANSETRON HCL 4 MG/2ML IJ SOLN
INTRAMUSCULAR | Status: DC | PRN
  Administered 2022-01-29: 4 mg via INTRAVENOUS

## 2022-01-29 MED ORDER — PROPOFOL 10 MG/ML IV BOLUS
INTRAVENOUS | Status: DC | PRN
  Administered 2022-01-29: 120 mg via INTRAVENOUS
  Administered 2022-01-29: 20 mg via INTRAVENOUS

## 2022-01-29 MED ORDER — EPHEDRINE 5 MG/ML INJ
INTRAVENOUS | Status: AC
  Filled 2022-01-29: qty 5

## 2022-01-29 MED ORDER — BUPIVACAINE-EPINEPHRINE (PF) 0.25% -1:200000 IJ SOLN
INTRAMUSCULAR | Status: DC | PRN
  Administered 2022-01-29: 30 mL

## 2022-01-29 SURGICAL SUPPLY — 42 items
ADAPTER IRRIG TUBE 2 SPIKE SOL (ADAPTER) ×2 IMPLANT
ADPR TBG 2 SPK PMP STRL ASCP (ADAPTER) ×1
APL PRP STRL LF DISP 70% ISPRP (MISCELLANEOUS) ×1
BLADE FULL RADIUS 3.5 (BLADE) IMPLANT
BLADE INCISOR PLUS 4.5 (BLADE) ×1 IMPLANT
BLADE SHAVER 4.5 DBL SERAT CV (CUTTER) ×1 IMPLANT
BLADE SURG SZ11 CARB STEEL (BLADE) ×1 IMPLANT
BRUSH SCRUB EZ  4% CHG (MISCELLANEOUS) ×2
BRUSH SCRUB EZ 4% CHG (MISCELLANEOUS) ×2 IMPLANT
CHLORAPREP W/TINT 26 (MISCELLANEOUS) ×1 IMPLANT
COOLER POLAR GLACIER W/PUMP (MISCELLANEOUS) IMPLANT
DRAPE ARTHRO LIMB 89X125 STRL (DRAPES) ×1 IMPLANT
DRAPE IMP U-DRAPE 54X76 (DRAPES) ×1 IMPLANT
GAUZE SPONGE 4X4 12PLY STRL (GAUZE/BANDAGES/DRESSINGS) ×1 IMPLANT
GAUZE XEROFORM 1X8 LF (GAUZE/BANDAGES/DRESSINGS) ×1 IMPLANT
GLOVE BIO SURGEON STRL SZ7.5 (GLOVE) ×1 IMPLANT
GLOVE SURG UNDER POLY LF SZ7.5 (GLOVE) ×1 IMPLANT
GOWN STRL REUS W/ TWL LRG LVL3 (GOWN DISPOSABLE) ×1 IMPLANT
GOWN STRL REUS W/ TWL XL LVL3 (GOWN DISPOSABLE) ×1 IMPLANT
GOWN STRL REUS W/TWL LRG LVL3 (GOWN DISPOSABLE) ×1
GOWN STRL REUS W/TWL XL LVL3 (GOWN DISPOSABLE) ×1
IV LACTATED RINGER IRRG 3000ML (IV SOLUTION) ×2
IV LR IRRIG 3000ML ARTHROMATIC (IV SOLUTION) ×2 IMPLANT
KIT TURNOVER KIT A (KITS) ×1 IMPLANT
MANIFOLD NEPTUNE II (INSTRUMENTS) ×2 IMPLANT
MAT ABSORB  FLUID 56X50 GRAY (MISCELLANEOUS) ×1
MAT ABSORB FLUID 56X50 GRAY (MISCELLANEOUS) ×1 IMPLANT
NDL SPNL 20GX3.5 QUINCKE YW (NEEDLE) ×1 IMPLANT
NEEDLE SPNL 20GX3.5 QUINCKE YW (NEEDLE) ×1 IMPLANT
PACK ARTHROSCOPY KNEE (MISCELLANEOUS) ×1 IMPLANT
PAD ABD DERMACEA PRESS 5X9 (GAUZE/BANDAGES/DRESSINGS) ×2 IMPLANT
PAD WRAPON POLAR KNEE (MISCELLANEOUS) IMPLANT
SPONGE T-LAP 18X18 ~~LOC~~+RFID (SPONGE) ×1 IMPLANT
SUT ETHILON 4-0 (SUTURE)
SUT ETHILON 4-0 FS2 18XMFL BLK (SUTURE)
SUTURE ETHLN 4-0 FS2 18XMF BLK (SUTURE) ×1 IMPLANT
TRAP FLUID SMOKE EVACUATOR (MISCELLANEOUS) ×1 IMPLANT
TUBING INFLOW SET DBFLO PUMP (TUBING) ×1 IMPLANT
TUBING OUTFLOW SET DBLFO PUMP (TUBING) IMPLANT
WAND WEREWOLF FLOW 90D (MISCELLANEOUS) IMPLANT
WATER STERILE IRR 500ML POUR (IV SOLUTION) ×1 IMPLANT
WRAPON POLAR PAD KNEE (MISCELLANEOUS)

## 2022-01-29 NOTE — Anesthesia Postprocedure Evaluation (Signed)
Anesthesia Post Note  Patient: Katie Hale  Procedure(s) Performed: KNEE ARTHROSCOPY WITH PARTIAL MEDIAL MENISECTOMY (Right: Knee)  Patient location during evaluation: PACU Anesthesia Type: General Level of consciousness: awake and alert Pain management: pain level controlled Vital Signs Assessment: post-procedure vital signs reviewed and stable Respiratory status: spontaneous breathing, nonlabored ventilation, respiratory function stable and patient connected to nasal cannula oxygen Cardiovascular status: blood pressure returned to baseline and stable Postop Assessment: no apparent nausea or vomiting Anesthetic complications: no   No notable events documented.   Last Vitals:  Vitals:   01/29/22 1345 01/29/22 1357  BP: (!) 150/75 (!) 149/76  Pulse: (!) 58 (!) 58  Resp: 12 14  Temp: (!) 36.1 C 36.7 C  SpO2: 96% 96%    Last Pain:  Vitals:   01/29/22 1357  TempSrc: Temporal  PainSc: [name]

## 2022-01-29 NOTE — Discharge Instructions (Addendum)
Postop instructions:  Take pain medication as instructed including Celebrex, Tylenol around-the-clock and narcotic as needed  Recommend aspirin 81 mg x 7 days for DVT prophylaxis starting tomorrow  Keep dressing clean and dry and in place for 3 to 4 days before removal  Okay to shower in 3 to 4 days  Ice and elevate to help with swelling and pain  Clinic follow-up in 10 to 14 days  Please call with any questions or concerns- [phone].  AMBULATORY SURGERY  DISCHARGE INSTRUCTIONS   The drugs that you were given will stay in your system until tomorrow so for the next 24 hours you should not:  Drive an automobile Make any legal decisions Drink any alcoholic beverage   You may resume regular meals tomorrow.  Today it is better to start with liquids and gradually work up to solid foods.  You may eat anything you prefer, but it is better to start with liquids, then soup and crackers, and gradually work up to solid foods.   Please notify your doctor immediately if you have any unusual bleeding, trouble breathing, redness and pain at the surgery site, drainage, fever, or pain not relieved by medication.    Additional Instructions:   Please contact your physician with any problems or Same Day Surgery at [phone], Monday through Friday 6 am to 4 pm, or [place] at [facility] number at [phone].

## 2022-01-29 NOTE — Anesthesia Procedure Notes (Signed)
Procedure Name: LMA Insertion Date/Time: 01/29/2022 11:28 AM  Performed by: Tollie Eth, CRNAPre-anesthesia Checklist: Patient identified, Patient being monitored, Timeout performed, Emergency Drugs available and Suction available Patient Re-evaluated:Patient Re-evaluated prior to induction Oxygen Delivery Method: Circle system utilized Preoxygenation: Pre-oxygenation with 100% oxygen Induction Type: IV induction Ventilation: Mask ventilation without difficulty LMA: LMA inserted LMA Size: 3.0 Tube type: Oral Number of attempts: 1 Placement Confirmation: positive ETCO2 and breath sounds checked- equal and bilateral Tube secured with: Tape Dental Injury: Teeth and Oropharynx as per pre-operative assessment

## 2022-01-29 NOTE — Transfer of Care (Signed)
Immediate Anesthesia Transfer of Care Note  Patient: Katie Hale  Procedure(s) Performed: KNEE ARTHROSCOPY WITH PARTIAL MEDIAL MENISECTOMY (Right: Knee)  Patient Location: PACU  Anesthesia Type:General  Level of Consciousness: drowsy  Airway & Oxygen Therapy: Patient Spontanous Breathing and Patient connected to face mask oxygen  Post-op Assessment: Report given to RN and Post -op Vital signs reviewed and stable  Post vital signs: Reviewed and stable  Last Vitals:  Vitals Value Taken Time  BP    Temp    Pulse 76 01/29/22 1239  Resp 22 01/29/22 1239  SpO2 99 % 01/29/22 1239  Vitals shown include unvalidated device data.  Last Pain:  Vitals:   01/29/22 1025  TempSrc: Temporal  PainSc: 3          Complications: No notable events documented.

## 2022-01-29 NOTE — H&P (Signed)
The patient has been re-examined, and the chart reviewed, and there have been no interval changes to the documented history and physical.    The risks, benefits, and alternatives have been discussed at length, and the patient is willing to proceed.    Katie Hale  

## 2022-01-29 NOTE — Op Note (Signed)
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  Right knee complex medial meniscus tear  2.  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 female with a diagnosis of S83.241A Oth tear of medial meniscus, current injury, r knee, init who failed conservative measures and elected for surgical management.     The risks benefits and alternatives were discussed with the patient preoperatively including but not limited to the risks of infection, bleeding, nerve or blood vessel injury, knee stiffness/arthrofibrosis, hardware failure, re-tear of the anterior cruciate ligament graft, persistent pain or instability, osteoarthritis and the need for revision surgery.  Medical risks include but are not limited to DVT and pulmonary embolism, stroke, pneumonia, respiratory failure and death. Patient understood these risks and wished to proceed with surgical reconstruction.    OPERATIVE IMPLANTS: None   OPERATIVE FINDINGS: Advance grade 4 chondromalacia diffusely along the medial tibial plateau and medial femoral condyle, complex posterior horn/mid body medial meniscus tear with radial and horizontal cleavage components, grade II/III chondromalacia of trochlea, grade 1/2 softening of lateral tibial plateau, intact lateral femoral condyle and patellar chondral surfaces, intact lateral meniscus, intact ACL     OPERATIVE PROCEDURE: Patient was met in the preoperative area. The right knee was marked with the word yes according the hospital's correct site of surgery protocol. The patient was brought  to the operating room and placed in the supine position. General anesthesia was administered.  Ancef were given for antibiotic prophylaxis. The lower extremity was prepped and draped in usual sterile fashion.    A time out was performed to verify the patient's name, date of birth, medical record number, correct site of surgery correct procedure to be performed. It was also used to verify the patient received antibiotics and all appropriate instruments, implants and radiographs studies were available in the room. Once all in attendance were in agreement case began.    Standard medial and lateral arthroscopic portals were made.  Arthroscope was inserted and the joint was insufflated with normal saline.  See operative findings above.  Gentle chondroplasty was performed with use of a shaver within the trochlea.  Within the intercondylar notch, ACL fibers were intact.  Within the medial compartment, there was diffuse grade 4/grade 3 chondromalacia along the medial tibial plateau and medial femoral condyle.  Gentle chondroplasty was performed.  There is a complex medial meniscus tear extending from the mid body towards the posterior horn.  The root was intact.  Shaver and meniscal biters were utilized to perform a partial medial meniscectomy back to a stable edge.  There was a radial tear at the junction of the posterior horn and mid body, which was debrided and stabilized.  Within the lateral compartment, lateral meniscus was intact, there was grade 1/2 softening of the lateral tibial plateau.  Lateral femoral condyle cartilage was intact.   Final pictures were obtained.  Knee was drained of fluid and 30 cc of quarter percent Marcaine was injected.  Portals were closed with 3-0 Monocryl followed by Steri-Strips and a sterile dressing.   The patient was awakened and brought to PACU in stable condition.  Postop plan: Patient will follow the knee arthroscopy, partial medial meniscectomy protocol.  She will be  weightbearing as tolerated.  She will start physical therapy within the next week to work on knee motion and quad activation exercises.  She will follow-up in clinic in 2 weeks for clinical check.  No x-rays necessary.  Patient previously underwent cortisone injections and hyaluronic acid injections without significant relief in the last 2 to 3 months, so it is unlikely that we will try this postoperatively.   Renee Harder

## 2022-01-29 NOTE — Anesthesia Preprocedure Evaluation (Signed)
Anesthesia Evaluation  Patient identified by MRN, date of birth, ID band Patient awake    Reviewed: Allergy & Precautions, NPO status , Patient's Chart, lab work & pertinent test results  History of Anesthesia Complications Negative for: history of anesthetic complications  Airway Mallampati: III  TM Distance: >3 FB Neck ROM: full    Dental  (+) Teeth Intact   Pulmonary neg pulmonary ROS, neg shortness of breath, neg COPD,    Pulmonary exam normal        Cardiovascular hypertension, (-) Past MI and (-) CABG negative cardio ROS Normal cardiovascular exam     Neuro/Psych negative neurological ROS  negative psych ROS   GI/Hepatic negative GI ROS, Neg liver ROS,   Endo/Other  negative endocrine ROS  Renal/GU      Musculoskeletal   Abdominal   Peds  Hematology negative hematology ROS (+)   Anesthesia Other Findings Past Medical History: No date: Arthritis No date: Dry eye No date: Eczema No date: Essential tremor     Comment:  takes Propranolol No date: Fluttering heart     Comment:  cut out caffeine and palpitations resolved No date: GERD (gastroesophageal reflux disease) No date: HTN (hypertension) No date: Insomnia No date: Plantar fasciitis No date: PONV (postoperative nausea and vomiting)     Comment:  with hysterectomy only No date: Vertigo  Past Surgical History: No date: ABDOMINAL HYSTERECTOMY No date: APPENDECTOMY No date: BICEPS TENDON REPAIR; Right 1975: BREAST BIOPSY; Left     Comment:  neg-noscar seen No date: BREAST SURGERY     Comment:  breast biopsy, benign No date: CATARACT EXTRACTION, BILATERAL No date: CERVICAL FUSION No date: CHOLECYSTECTOMY 07/25/2018: COLONOSCOPY WITH PROPOFOL; N/A     Comment:  Procedure: COLONOSCOPY WITH PROPOFOL;  Surgeon: Lin Landsman, MD;  Location: [facility] ENDOSCOPY;  Service:               Gastroenterology;  Laterality: N/A; 08/2021:  EYE SURGERY 01/2018: HAND TUMOR EXCISION; Left No date: KNEE ARTHROSCOPY WITH DRILLING/MICROFRACTURE; Left No date: SHOULDER ARTHROSCOPY; Right  BMI    Body Mass Index: 31.09 kg/m      Reproductive/Obstetrics negative OB ROS                             Anesthesia Physical Anesthesia Plan  ASA: 2  Anesthesia Plan: General   Post-op Pain Management:    Induction: Intravenous  PONV Risk Score and Plan: 3 and Ondansetron and Dexamethasone  Airway Management Planned: LMA  Additional Equipment:   Intra-op Plan:   Post-operative Plan: Extubation in OR  Informed Consent: I have reviewed the patients History and Physical, chart, labs and discussed the procedure including the risks, benefits and alternatives for the proposed anesthesia with the patient or authorized representative who has indicated his/her understanding and acceptance.     Dental Advisory Given  Plan Discussed with: Anesthesiologist, CRNA and Surgeon  Anesthesia Plan Comments: (Patient consented for risks of anesthesia including but not limited to:  - adverse reactions to medications - damage to eyes, teeth, lips or other oral mucosa - nerve damage due to positioning  - sore throat or hoarseness - Damage to heart, brain, nerves, lungs, other parts of body or loss of life  Patient voiced understanding.)        Anesthesia Quick Evaluation

## 2022-01-30 ENCOUNTER — Encounter: Payer: Self-pay | Admitting: Orthopaedic Surgery

## 2022-02-08 ENCOUNTER — Other Ambulatory Visit: Payer: Self-pay | Admitting: Family Medicine

## 2022-02-19 DIAGNOSIS — M25561 Pain in right knee: Secondary | ICD-10-CM | POA: Diagnosis not present

## 2022-02-23 ENCOUNTER — Encounter: Payer: Self-pay | Admitting: Family Medicine

## 2022-02-23 DIAGNOSIS — Z23 Encounter for immunization: Secondary | ICD-10-CM | POA: Diagnosis not present

## 2022-02-24 DIAGNOSIS — M25561 Pain in right knee: Secondary | ICD-10-CM | POA: Diagnosis not present

## 2022-03-03 DIAGNOSIS — M25561 Pain in right knee: Secondary | ICD-10-CM | POA: Diagnosis not present

## 2022-03-05 DIAGNOSIS — M25561 Pain in right knee: Secondary | ICD-10-CM | POA: Diagnosis not present

## 2022-03-09 DIAGNOSIS — M25561 Pain in right knee: Secondary | ICD-10-CM | POA: Diagnosis not present

## 2022-03-12 DIAGNOSIS — M25561 Pain in right knee: Secondary | ICD-10-CM | POA: Diagnosis not present

## 2022-03-18 DIAGNOSIS — M25561 Pain in right knee: Secondary | ICD-10-CM | POA: Diagnosis not present

## 2022-04-08 DIAGNOSIS — M25561 Pain in right knee: Secondary | ICD-10-CM | POA: Diagnosis not present

## 2022-04-20 ENCOUNTER — Encounter: Payer: Self-pay | Admitting: Family Medicine

## 2022-04-20 ENCOUNTER — Other Ambulatory Visit: Payer: Self-pay | Admitting: Family Medicine

## 2022-04-20 DIAGNOSIS — Z1231 Encounter for screening mammogram for malignant neoplasm of breast: Secondary | ICD-10-CM

## 2022-04-22 DIAGNOSIS — M25561 Pain in right knee: Secondary | ICD-10-CM | POA: Diagnosis not present

## 2022-05-13 DIAGNOSIS — M25561 Pain in right knee: Secondary | ICD-10-CM | POA: Diagnosis not present

## 2022-06-07 ENCOUNTER — Other Ambulatory Visit: Payer: Self-pay | Admitting: Family Medicine

## 2022-06-10 ENCOUNTER — Other Ambulatory Visit: Payer: Self-pay | Admitting: Family Medicine

## 2022-06-10 DIAGNOSIS — M899 Disorder of bone, unspecified: Secondary | ICD-10-CM

## 2022-06-10 DIAGNOSIS — E785 Hyperlipidemia, unspecified: Secondary | ICD-10-CM

## 2022-06-12 DIAGNOSIS — M1711 Unilateral primary osteoarthritis, right knee: Secondary | ICD-10-CM | POA: Diagnosis not present

## 2022-06-16 DIAGNOSIS — Z961 Presence of intraocular lens: Secondary | ICD-10-CM | POA: Diagnosis not present

## 2022-06-16 DIAGNOSIS — H179 Unspecified corneal scar and opacity: Secondary | ICD-10-CM | POA: Diagnosis not present

## 2022-06-24 ENCOUNTER — Ambulatory Visit
Admission: RE | Admit: 2022-06-24 | Discharge: 2022-06-24 | Disposition: A | Discharge status: Home / Self Care | Payer: Medicare PPO | Source: Ambulatory Visit | Attending: Family Medicine | Admitting: Family Medicine

## 2022-06-24 DIAGNOSIS — Z1231 Encounter for screening mammogram for malignant neoplasm of breast: Secondary | ICD-10-CM | POA: Insufficient documentation

## 2022-06-25 ENCOUNTER — Other Ambulatory Visit: Payer: Self-pay | Admitting: Family Medicine

## 2022-06-25 DIAGNOSIS — N6489 Other specified disorders of breast: Secondary | ICD-10-CM

## 2022-06-25 DIAGNOSIS — R928 Other abnormal and inconclusive findings on diagnostic imaging of breast: Secondary | ICD-10-CM

## 2022-06-26 ENCOUNTER — Other Ambulatory Visit (INDEPENDENT_AMBULATORY_CARE_PROVIDER_SITE_OTHER): Payer: Medicare PPO

## 2022-06-26 DIAGNOSIS — M899 Disorder of bone, unspecified: Secondary | ICD-10-CM | POA: Diagnosis not present

## 2022-06-26 DIAGNOSIS — M949 Disorder of cartilage, unspecified: Secondary | ICD-10-CM | POA: Diagnosis not present

## 2022-06-26 DIAGNOSIS — E785 Hyperlipidemia, unspecified: Secondary | ICD-10-CM | POA: Diagnosis not present

## 2022-06-26 LAB — LIPID PANEL
Cholesterol: 203 mg/dL — ABNORMAL HIGH (ref 0–200)
HDL: 63.5 mg/dL (ref >39.00)
LDL Cholesterol: 117 mg/dL — ABNORMAL HIGH (ref 0–99)
NonHDL: 139.52
Total CHOL/HDL Ratio: 3
Triglycerides: 112 mg/dL (ref 0.0–149.0)
VLDL: 22.4 mg/dL (ref 0.0–40.0)

## 2022-06-26 LAB — COMPREHENSIVE METABOLIC PANEL
ALT: 10 U/L (ref 0–35)
AST: 14 U/L (ref 0–37)
Albumin: 4.1 g/dL (ref 3.5–5.2)
Alkaline Phosphatase: 87 U/L (ref 39–117)
BUN: 18 mg/dL (ref 6–23)
CO2: 30 mEq/L (ref 19–32)
Calcium: 9.2 mg/dL (ref 8.4–10.5)
Chloride: 104 mEq/L (ref 96–112)
Creatinine, Ser: 0.75 mg/dL (ref 0.40–1.20)
GFR: 76.27 mL/min (ref >60.00)
Glucose, Bld: 94 mg/dL (ref 70–99)
Potassium: 4.1 mEq/L (ref 3.5–5.1)
Sodium: 142 mEq/L (ref 135–145)
Total Bilirubin: 0.4 mg/dL (ref 0.2–1.2)
Total Protein: 6.5 g/dL (ref 6.0–8.3)

## 2022-06-26 LAB — TSH: TSH: 1.49 u[IU]/mL (ref 0.35–5.50)

## 2022-06-26 LAB — VITAMIN D 25 HYDROXY (VIT D DEFICIENCY, FRACTURES): VITD: 39.03 ng/mL (ref 30.00–100.00)

## 2022-06-29 ENCOUNTER — Ambulatory Visit (INDEPENDENT_AMBULATORY_CARE_PROVIDER_SITE_OTHER): Payer: Medicare PPO

## 2022-06-29 VITALS — Ht 62.0 in | Wt 179.0 lb

## 2022-06-29 DIAGNOSIS — Z0000 Encounter for general adult medical examination without abnormal findings: Secondary | ICD-10-CM | POA: Diagnosis not present

## 2022-06-29 NOTE — Patient Instructions (Signed)
Katie Hale , Thank you for taking time to come for your Medicare Wellness Visit. I appreciate your ongoing commitment to your health goals. Please review the following plan we discussed and let me know if I can assist you in the future.   These are the goals we discussed:  Goals      DIET - EAT MORE FRUITS AND VEGETABLES     Increase physical activity     Starting 06/17/2018, I will continue to walk 30-45 minutes 4 days per week.      Patient Stated     06/22/2019, I will maintain and continue medications as prescribed.         This is a list of the screening recommended for you and due dates:  Health Maintenance  Topic Date Due   DTaP/Tdap/Td vaccine (2 - Tdap) 06/08/2016   COVID-19 Vaccine (6 - 2023-24 season) 06/12/2022   Medicare Annual Wellness Visit  06/30/2023   Colon Cancer Screening  07/26/2023   Pneumonia Vaccine  Completed   Flu Shot  Completed   DEXA scan (bone density measurement)  Completed   Hepatitis C Screening: USPSTF Recommendation to screen - Ages 90-79 yo.  Completed   Zoster (Shingles) Vaccine  Completed   HPV Vaccine  Aged Out    Advanced directives: no  Conditions/risks identified: none  Next appointment: Follow up in one year for your annual wellness visit 07/01/23 @ 8:15 am by phone   Preventive Care 65 Years and Older, Female Preventive care refers to lifestyle choices and visits with your health care provider that can promote health and wellness. What does preventive care include? A yearly physical exam. This is also called an annual well check. Dental exams once or twice a year. Routine eye exams. Ask your health care provider how often you should have your eyes checked. Personal lifestyle choices, including: Daily care of your teeth and gums. Regular physical activity. Eating a healthy diet. Avoiding tobacco and drug use. Limiting alcohol use. Practicing safe sex. Taking low-dose aspirin every day. Taking vitamin and mineral supplements as  recommended by your health care provider. What happens during an annual well check? The services and screenings done by your health care provider during your annual well check will depend on your age, overall health, lifestyle risk factors, and family history of disease. Counseling  Your health care provider may ask you questions about your: Alcohol use. Tobacco use. Drug use. Emotional well-being. Home and relationship well-being. Sexual activity. Eating habits. History of falls. Memory and ability to understand (cognition). Work and work Statistician. Reproductive health. Screening  You may have the following tests or measurements: Height, weight, and BMI. Blood pressure. Lipid and cholesterol levels. These may be checked every 5 years, or more frequently if you are over 30 years old. Skin check. Lung cancer screening. You may have this screening every year starting at age 48 if you have a 30-pack-year history of smoking and currently smoke or have quit within the past 15 years. Fecal occult blood test (FOBT) of the stool. You may have this test every year starting at age 32. Flexible sigmoidoscopy or colonoscopy. You may have a sigmoidoscopy every 5 years or a colonoscopy every 10 years starting at age 67. Hepatitis C blood test. Hepatitis B blood test. Sexually transmitted disease (STD) testing. Diabetes screening. This is done by checking your blood sugar (glucose) after you have not eaten for a while (fasting). You may have this done every 1-3 years. Bone density scan.  This is done to screen for osteoporosis. You may have this done starting at age 81. Mammogram. This may be done every 1-2 years. Talk to your health care provider about how often you should have regular mammograms. Talk with your health care provider about your test results, treatment options, and if necessary, the need for more tests. Vaccines  Your health care provider may recommend certain vaccines, such  as: Influenza vaccine. This is recommended every year. Tetanus, diphtheria, and acellular pertussis (Tdap, Td) vaccine. You may need a Td booster every 10 years. Zoster vaccine. You may need this after age 73. Pneumococcal 13-valent conjugate (PCV13) vaccine. One dose is recommended after age 78. Pneumococcal polysaccharide (PPSV23) vaccine. One dose is recommended after age 18. Talk to your health care provider about which screenings and vaccines you need and how often you need them. This information is not intended to replace advice given to you by your health care provider. Make sure you discuss any questions you have with your health care provider. Document Released: 06/21/2015 Document Revised: 02/12/2016 Document Reviewed: 03/26/2015 Elsevier Interactive Patient Education  2017 [place] Prevention in the Home Falls can cause injuries. They can happen to people of all ages. There are many things you can do to make your home safe and to help prevent falls. What can I do on the outside of my home? Regularly fix the edges of walkways and driveways and fix any cracks. Remove anything that might make you trip as you walk through a door, such as a raised step or threshold. Trim any bushes or trees on the path to your home. Use bright outdoor lighting. Clear any walking paths of anything that might make someone trip, such as rocks or tools. Regularly check to see if handrails are loose or broken. Make sure that both sides of any steps have handrails. Any raised decks and porches should have guardrails on the edges. Have any leaves, snow, or ice cleared regularly. Use sand or salt on walking paths during winter. Clean up any spills in your garage right away. This includes oil or grease spills. What can I do in the bathroom? Use night lights. Install grab bars by the toilet and in the tub and shower. Do not use towel bars as grab bars. Use non-skid mats or decals in the tub or  shower. If you need to sit down in the shower, use a plastic, non-slip stool. Keep the floor dry. Clean up any water that spills on the floor as soon as it happens. Remove soap buildup in the tub or shower regularly. Attach bath mats securely with double-sided non-slip rug tape. Do not have throw rugs and other things on the floor that can make you trip. What can I do in the bedroom? Use night lights. Make sure that you have a light by your bed that is easy to reach. Do not use any sheets or blankets that are too big for your bed. They should not hang down onto the floor. Have a firm chair that has side arms. You can use this for support while you get dressed. Do not have throw rugs and other things on the floor that can make you trip. What can I do in the kitchen? Clean up any spills right away. Avoid walking on wet floors. Keep items that you use a lot in easy-to-reach places. If you need to reach something above you, use a strong step stool that has a grab bar. Keep electrical cords  out of the way. Do not use floor polish or wax that makes floors slippery. If you must use wax, use non-skid floor wax. Do not have throw rugs and other things on the floor that can make you trip. What can I do with my stairs? Do not leave any items on the stairs. Make sure that there are handrails on both sides of the stairs and use them. Fix handrails that are broken or loose. Make sure that handrails are as long as the stairways. Check any carpeting to make sure that it is firmly attached to the stairs. Fix any carpet that is loose or worn. Avoid having throw rugs at the top or bottom of the stairs. If you do have throw rugs, attach them to the floor with carpet tape. Make sure that you have a light switch at the top of the stairs and the bottom of the stairs. If you do not have them, ask someone to add them for you. What else can I do to help prevent falls? Wear shoes that: Do not have high heels. Have  rubber bottoms. Are comfortable and fit you well. Are closed at the toe. Do not wear sandals. If you use a stepladder: Make sure that it is fully opened. Do not climb a closed stepladder. Make sure that both sides of the stepladder are locked into place. Ask someone to hold it for you, if possible. Clearly mark and make sure that you can see: Any grab bars or handrails. First and last steps. Where the edge of each step is. Use tools that help you move around (mobility aids) if they are needed. These include: Canes. Walkers. Scooters. Crutches. Turn on the lights when you go into a dark area. Replace any light bulbs as soon as they burn out. Set up your furniture so you have a clear path. Avoid moving your furniture around. If any of your floors are uneven, fix them. If there are any pets around you, be aware of where they are. Review your medicines with your doctor. Some medicines can make you feel dizzy. This can increase your chance of falling. Ask your doctor what other things that you can do to help prevent falls. This information is not intended to replace advice given to you by your health care provider. Make sure you discuss any questions you have with your health care provider. Document Released: 03/21/2009 Document Revised: 10/31/2015 Document Reviewed: 06/29/2014 Elsevier Interactive Patient Education  2017 [employer].

## 2022-06-29 NOTE — Progress Notes (Signed)
Virtual Visit via Telephone Note  I connected with  Katie Hale on 06/29/22 at  8:15 AM EST by telephone and verified that I am speaking with the correct person using two identifiers.  Location: Patient: home Provider: [place] Persons participating in the virtual visit: [place]   I discussed the limitations, risks, security and privacy concerns of performing an evaluation and management service by telephone and the availability of in person appointments. The patient expressed understanding and agreed to proceed.  Interactive audio and video telecommunications were attempted between this nurse and patient, however failed, due to patient having technical difficulties OR patient did not have access to video capability.  We continued and completed visit with audio only.  Some vital signs may be absent or patient reported.   Dionisio David, LPN  Subjective:   Katie Hale is a 76 y.o. female who presents for Medicare Annual (Subsequent) preventive examination.  Review of Systems     Cardiac Risk Factors include: advanced age (>75mn, >>55women);hypertension     Objective:    Today's Vitals   06/29/22 0817  PainSc: 4    There is no height or weight on file to calculate BMI.     06/29/2022    8:24 AM 01/29/2022   10:23 AM 01/22/2022    3:19 PM 11/11/2021    9:02 AM 04/22/2021   10:48 AM 04/15/2020    7:52 AM 08/01/2019    8:07 AM  Advanced Directives  Does Patient Have a Medical Advance Directive? No Yes Yes Yes Yes Yes Yes  Type of ASocial research officer, governmentLiving will   Living will Living will;Healthcare Power of Attorney   Would patient like information on creating a medical advance directive? No - Patient declined          Current Medications (verified) Outpatient Encounter Medications as of 06/29/2022  Medication Sig   Calcium Carb-Cholecalciferol (CALCIUM + D3) 600-200 MG-UNIT TABS Take 1 tablet by mouth 2 (two) times  daily. Take one tablet two times daily.   Cholecalciferol (VITAMIN D3) 2000 UNITS TABS Take 2,000 Units by mouth daily.   Lactobacillus (PROBIOTIC ACIDOPHILUS PO) Take 1 tablet by mouth daily.   Multiple Vitamin (MULTIVITAMIN) tablet Take 1 tablet by mouth daily.   omeprazole (PRILOSEC) 20 MG capsule TAKE 1 TO 2 CAPSULES(20 TO 40 MG) BY MOUTH DAILY (Patient taking differently: 40 mg every morning.)   ondansetron (ZOFRAN) 4 MG tablet TAKE 1 TABLET(4 MG) BY MOUTH EVERY 8 HOURS AS NEEDED FOR NAUSEA OR VOMITING   propranolol (INDERAL) 60 MG tablet TAKE 1 TABLET(60 MG) BY MOUTH TWICE DAILY   celecoxib (CELEBREX) 100 MG capsule Take 100 mg by mouth 2 (two) times daily.   Varenicline Tartrate (TYRVAYA) 0.03 MG/ACT SOLN Place 1 spray into the nose 2 (two) times daily.   No facility-administered encounter medications on file as of 06/29/2022.    Allergies (verified) Topamax [topiramate]   History: Past Medical History:  Diagnosis Date   Arthritis    Dry eye    Eczema    Essential tremor    takes Propranolol   Fluttering heart    cut out caffeine and palpitations resolved   GERD (gastroesophageal reflux disease)    HTN (hypertension)    Insomnia    Plantar fasciitis    PONV (postoperative nausea and vomiting)    with hysterectomy only   Vertigo    Past Surgical History:  Procedure Laterality Date   ABDOMINAL HYSTERECTOMY  APPENDECTOMY     BICEPS TENDON REPAIR Right    BREAST BIOPSY Left 1975   neg-noscar seen   BREAST SURGERY     breast biopsy, benign   CATARACT EXTRACTION, BILATERAL     CERVICAL FUSION     CHOLECYSTECTOMY     COLONOSCOPY WITH PROPOFOL N/A 07/25/2018   Procedure: COLONOSCOPY WITH PROPOFOL;  Surgeon: Lin Landsman, MD;  Location: [facility] ENDOSCOPY;  Service: Gastroenterology;  Laterality: N/A;   EYE SURGERY  08/2021   HAND TUMOR EXCISION Left 01/2018   KNEE ARTHROSCOPY WITH DRILLING/MICROFRACTURE Left    KNEE ARTHROSCOPY WITH MEDIAL MENISECTOMY Right  01/29/2022   Procedure: KNEE ARTHROSCOPY WITH PARTIAL MEDIAL MENISECTOMY;  Surgeon: Renee Harder, MD;  Location: [facility] ORS;  Service: Orthopedics;  Laterality: Right;   SHOULDER ARTHROSCOPY Right    Family History  Problem Relation Age of Onset   Tremor Mother    Breast cancer Mother 64   Diabetes Father    Tremor Father    Breast cancer Maternal Aunt 12   Tremor Sister    Tremor Brother    Healthy Child    Colon cancer Neg Hx    Social History   Socioeconomic History   Marital status: Widowed    Spouse name: Not on file   Number of children: 2   Years of education: Not on file   Highest education level: Associate degree: academic program  Occupational History   Occupation: retired    Comment: Solicitor  Tobacco Use   Smoking status: Never   Smokeless tobacco: Never  Vaping Use   Vaping Use: Never used  Substance and Sexual Activity   Alcohol use: Yes    Comment: occ wine   Drug use: No   Sexual activity: Not on file  Other Topics Concern   Not on file  Social History Narrative   Widowed 07/27/17- husband died of cancer on hospice    They had been married since 1988   Retired from [employer], Management consultant   From [place], [place]      Right handed   Social Determinants of Health   Financial Resource Strain: [place]  (06/29/2022)   Overall Financial Resource Strain (CARDIA)    Difficulty of Paying Living Expenses: Not hard at all  Food Insecurity: No [place] (06/29/2022)   Hunger Vital Sign    Worried About Running Out of Food in the Last Year: Never true    [place] in the Last Year: Never true  Transportation Needs: No Transportation Needs (06/29/2022)   PRAPARE - Hydrologist (Medical): No    Lack of Transportation (Non-Medical): No  Physical Activity: Insufficiently Active (06/29/2022)   Exercise Vital Sign    Days of Exercise per Week: 3 days    Minutes of Exercise per Session: 30 min  Stress: No  Stress Concern Present (06/29/2022)   [place]    Feeling of Stress : Only a little  Social Connections: Moderately Isolated (06/29/2022)   Social Connection and Isolation Panel [NHANES]    Frequency of Communication with Friends and Family: More than three times a week    Frequency of Social Gatherings with Friends and Family: Three times a week    Attends Religious Services: More than 4 times per year    Active Member of Clubs or Organizations: No    Attends Archivist Meetings: Never  Marital Status: Widowed    Tobacco Counseling Counseling given: Not Answered   Clinical Intake:  Pre-visit preparation completed: Yes  Pain : 0-10 Pain Score: 4  Pain Type: Chronic pain Pain Location: Knee Pain Orientation: Right Pain Relieving Factors: injection  Pain Relieving Factors: injection  Nutritional Risks: None Diabetes: No  How often do you need to have someone help you when you read instructions, pamphlets, or other written materials from your doctor or pharmacy?: 1 - Never  Diabetic?no  Interpreter Needed?: No  Information entered by :: Kirke Shaggy, LPN   Activities of Daily Living    06/29/2022    8:24 AM 01/22/2022    3:09 PM  In your present state of health, do you have any difficulty performing the following activities:  Hearing? 0   Vision? 0   Difficulty concentrating or making decisions? 0   Walking or climbing stairs? 0   Dressing or bathing? 0   Doing errands, shopping? 0 0  Preparing Food and eating ? N   Using the Toilet? N   In the past six months, have you accidently leaked urine? N   Do you have problems with loss of bowel control? N   Managing your Medications? N   Managing your Finances? N   Housekeeping or managing your Housekeeping? N     Patient Care Team: Tonia Ghent, MD as PCP - General (Family Medicine) Tat, Eustace Quail, DO as Consulting Physician  (Neurology)  Indicate any recent Medical Services you may have received from other than [facility] providers in the past year (date may be approximate).     Assessment:   This is a routine wellness examination for Katie Hale.  Hearing/Vision screen Hearing Screening - Comments:: No aids Vision Screening - Comments:: Readers- [facility]  Dietary issues and exercise activities discussed: Current Exercise Habits: Home exercise routine, Type of exercise: walking, Time (Minutes): 30, Frequency (Times/Week): 3, Weekly Exercise (Minutes/Week): 90, Intensity: Mild   Goals Addressed             This Visit's Progress    DIET - EAT MORE FRUITS AND VEGETABLES         Depression Screen    06/29/2022    8:21 AM 06/27/2021    2:49 PM 06/27/2020    2:09 PM 06/22/2019    2:09 PM 06/17/2018    8:27 AM 06/07/2017   11:35 AM  PHQ 2/9 Scores  PHQ - 2 Score 0 0 0 0 2 0  PHQ- 9 Score 0   0 4     Fall Risk    06/29/2022    8:24 AM 11/11/2021    9:02 AM 04/22/2021   10:48 AM 06/27/2020    2:08 PM 04/15/2020    7:52 AM  Fall Risk   Falls in the past year? 0 0 0 1 0  Number falls in past yr: 0 0 0 1 0  Injury with Fall? 0 0 0 0 0  Risk for fall due to : No Fall Risks   History of fall(s)   Follow up Falls prevention discussed;Falls evaluation completed   Falls evaluation completed     FALL RISK PREVENTION PERTAINING TO THE HOME:  Any stairs in or around the home? Yes  If so, are there any without handrails? No  Home free of loose throw rugs in walkways, pet beds, electrical cords, etc? Yes  Adequate lighting in your home to reduce risk of falls? Yes   ASSISTIVE DEVICES UTILIZED TO  PREVENT FALLS:  Life alert? Yes  Use of a cane, walker or w/c? Yes  Grab bars in the bathroom? No  Shower chair or bench in shower? Yes  Elevated toilet seat or a handicapped toilet? Yes   Cognitive Function:    06/22/2019    2:11 PM 06/17/2018    8:28 AM  MMSE - Mini Mental State Exam  Orientation to time 5 5   Orientation to Place 5 5  Registration 3 3  Attention/ Calculation 5 0  Recall 3 3  Language- name 2 objects  0  Language- repeat 1 1  Language- follow 3 step command  3  Language- read & follow direction  0  Write a sentence  0  Copy design  0  Total score  20        06/29/2022    8:29 AM  6CIT Screen  What Year? 0 points  What month? 0 points  What time? 0 points  Count back from 20 0 points  Months in reverse 0 points  Repeat phrase 0 points  Total Score 0 points    Immunizations Immunization History  Administered Date(s) Administered   Fluad Quad(high Dose 65+) 02/23/2022   Influenza, High Dose Seasonal PF 03/08/2016, 02/22/2018, 02/02/2019, 02/12/2021, 02/23/2022   Influenza-Unspecified 03/08/2014, 03/09/2015, 04/04/2017, 02/22/2018, 02/21/2020   Moderna SARS-COV2 Booster Vaccination 10/14/2020, 04/17/2022   Moderna Sars-Covid-2 Vaccination 06/20/2019, 07/18/2019, 04/23/2020, 10/14/2020, 02/27/2021   Pneumococcal Conjugate-13 05/22/2015   Pneumococcal Polysaccharide-23 06/09/2015   Td 06/08/2006   Zoster Recombinat (Shingrix) 12/22/2017, 02/22/2018   Zoster, Live 06/08/2002    TDAP status: Due, Education has been provided regarding the importance of this vaccine. Advised may receive this vaccine at local pharmacy or Health Dept. Aware to provide a copy of the vaccination record if obtained from local pharmacy or Health Dept. Verbalized acceptance and understanding.  Flu Vaccine status: Up to date  Pneumococcal vaccine status: Up to date  Covid-19 vaccine status: Completed vaccines  Qualifies for Shingles Vaccine? Yes   Zostavax completed Yes   Shingrix Completed?: Yes  Screening Tests Health Maintenance  Topic Date Due   DTaP/Tdap/Td (2 - Tdap) 06/08/2016   COVID-19 Vaccine (6 - 2023-24 season) 06/12/2022   Medicare Annual Wellness (AWV)  06/30/2023   COLONOSCOPY (Pts 45-91yr Insurance coverage will need to be confirmed)  07/26/2023   Pneumonia  Vaccine 79 Years old  Completed   INFLUENZA VACCINE  Completed   DEXA SCAN  Completed   Hepatitis C Screening  Completed   Zoster Vaccines- Shingrix  Completed   HPV VACCINES  Aged Out    Health Maintenance  Health Maintenance Due  Topic Date Due   DTaP/Tdap/Td (2 - Tdap) 06/08/2016   COVID-19 Vaccine (6 - 2023-24 season) 06/12/2022    Colorectal cancer screening: Type of screening: Colonoscopy. Completed 20/17/20. Repeat every 5 years  Mammogram status: Completed 06/24/21, waiting on results. Repeat every year  Bone Density status: Completed 10/23/21. Results reflect: Bone density results: NORMAL. Repeat every 5 years.  Lung Cancer Screening: (Low Dose CT Chest recommended if Age 971-80years, 30 pack-year currently smoking OR have quit w/in 15years.) does not qualify.   Additional Screening:  Hepatitis C Screening: does qualify; Completed 05/15/16  Vision Screening: Recommended annual ophthalmology exams for early detection of glaucoma and other disorders of the eye. Is the patient up to date with their annual eye exam?  Yes  Who is the provider or what is the name of the office in which the  patient attends annual eye exams? [place] If pt is not established with a provider, would they like to be referred to a provider to establish care? No .   Dental Screening: Recommended annual dental exams for proper oral hygiene  Community Resource Referral / Chronic Care Management: CRR required this visit?  No   CCM required this visit?  No      Plan:     I have personally reviewed and noted the following in the patient's chart:   Medical and social history Use of alcohol, tobacco or illicit drugs  Current medications and supplements including opioid prescriptions. Patient is not currently taking opioid prescriptions. Functional ability and status Nutritional status Physical activity Advanced directives List of other physicians Hospitalizations, surgeries, and ER  visits in previous 12 months Vitals Screenings to include cognitive, depression, and falls Referrals and appointments  In addition, I have reviewed and discussed with patient certain preventive protocols, quality metrics, and best practice recommendations. A written personalized care plan for preventive services as well as general preventive health recommendations were provided to patient.     Dionisio David, LPN   06/30/4495   Nurse Notes: none

## 2022-07-01 ENCOUNTER — Ambulatory Visit
Admission: RE | Admit: 2022-07-01 | Discharge: 2022-07-01 | Disposition: A | Discharge status: Home / Self Care | Payer: Medicare PPO | Source: Ambulatory Visit | Attending: Family Medicine | Admitting: Family Medicine

## 2022-07-01 DIAGNOSIS — N6489 Other specified disorders of breast: Secondary | ICD-10-CM | POA: Diagnosis not present

## 2022-07-01 DIAGNOSIS — R928 Other abnormal and inconclusive findings on diagnostic imaging of breast: Secondary | ICD-10-CM | POA: Diagnosis not present

## 2022-07-03 ENCOUNTER — Encounter: Payer: Self-pay | Admitting: Family Medicine

## 2022-07-03 ENCOUNTER — Ambulatory Visit (INDEPENDENT_AMBULATORY_CARE_PROVIDER_SITE_OTHER): Payer: Medicare PPO | Admitting: Family Medicine

## 2022-07-03 VITALS — BP 122/80 | HR 60 | Temp 98.0°F | Ht 62.0 in | Wt 176.0 lb

## 2022-07-03 DIAGNOSIS — Z0000 Encounter for general adult medical examination without abnormal findings: Secondary | ICD-10-CM

## 2022-07-03 DIAGNOSIS — K219 Gastro-esophageal reflux disease without esophagitis: Secondary | ICD-10-CM

## 2022-07-03 DIAGNOSIS — Z7189 Other specified counseling: Secondary | ICD-10-CM

## 2022-07-03 DIAGNOSIS — M1990 Unspecified osteoarthritis, unspecified site: Secondary | ICD-10-CM

## 2022-07-03 DIAGNOSIS — G250 Essential tremor: Secondary | ICD-10-CM

## 2022-07-03 MED ORDER — ONDANSETRON HCL 4 MG PO TABS: ORAL_TABLET | ORAL | 2 refills | Status: DC

## 2022-07-03 MED ORDER — OMEPRAZOLE 40 MG PO CPDR
40.0000 mg | DELAYED_RELEASE_CAPSULE | ORAL | 3 refills | Status: DC
Start: 2022-07-03 — End: 2023-07-09

## 2022-07-03 MED ORDER — PROPRANOLOL HCL 60 MG PO TABS: 60.0000 mg | ORAL_TABLET | Freq: Two times a day (BID) | ORAL | 3 refills | Status: DC

## 2022-07-03 NOTE — Patient Instructions (Signed)
Don't change your meds for now.  Update me as needed.  Take care.  Glad to see you.   

## 2022-07-03 NOTE — Progress Notes (Unsigned)
No CP, not SOB.  not lightheaded.  I presume that she does have hypertension that is usually controlled by her beta-blocker.  Discussed with patient.   Tremor.  Still with some symptoms, progressed from a few years ago, esp on L side.  No dysphagia. On propranolol at baseline.  She noted L>R hand sx.  Has seen neuro prev.  R handed. She is going to see neuro soon.  She can function as is.    GERD can cause nausea and sometimes needs zofran.  Still on PPI at baseline.  Rx sent.    Taking meloxicam prn for arthritis, ie every few days.  She had injection, arthroscopy on knee.  Routine NSAID cautions d/w pt.  Knee pain affects her exercise/life, d/w pt.     She has seen eye clinic.  I'll defer.  She agrees.  Her vision is clearly better.     Flu prev done. Shingles prev done. PNA 2017. Tetanus 2008, d/w pt. RSV vaccine d/w pt.   Colonoscopy 2020. Breast cancer screening 2024 DXA 2023. Okay to defer for now, d/w pt.   Advance directive- daughter designated if patient were incapacitated.   Labs d/w pt.   Meds, vitals, and allergies reviewed.   ROS: Per HPI unless specifically indicated in ROS section   GEN: nad, alert and oriented HEENT: ncat NECK: supple w/o LA CV: rrr. PULM: ctab, no inc wob ABD: soft, +bs EXT: no edema SKIN: no acute rash Tremor note at baseline, episodic.  Not resting.

## 2022-07-05 NOTE — Assessment & Plan Note (Signed)
Flu prev done. Shingles prev done. PNA 2017. Tetanus 2008, d/w pt. RSV vaccine d/w pt.   Colonoscopy 2020. Breast cancer screening 2024 DXA 2023. Okay to defer for now, d/w pt.   Advance directive- daughter designated if patient were incapacitated.   Labs d/w pt.

## 2022-07-05 NOTE — Assessment & Plan Note (Signed)
Would continue.  Meloxicam.  NSAID cautions discussed.  Continue PPI given NSAID use.

## 2022-07-05 NOTE — Assessment & Plan Note (Signed)
Advance directive- daughter designated if patient were incapacitated.

## 2022-07-05 NOTE — Assessment & Plan Note (Signed)
Continue omeprazole with as needed Zofran.

## 2022-07-05 NOTE — Assessment & Plan Note (Signed)
Continue propranolol.  She will update me as needed.  She is going to follow-up with neurology.

## 2022-07-06 ENCOUNTER — Encounter: Payer: Self-pay | Admitting: Family Medicine

## 2022-08-06 ENCOUNTER — Other Ambulatory Visit: Payer: Self-pay | Admitting: Family Medicine

## 2022-08-10 NOTE — Progress Notes (Unsigned)
Assessment/Plan:    1.  Essential Tremor  -She will continue propranolol, 60 mg twice per day.  -She did not tolerate topiramate or higher dosages of primidone (and did not find lower dosages of either effective)  -She tried xanax but made too sleepy  -discussed surgical interventions again, both focused ultrasound and DBS.  She is not interested.  -We had previously discussed [employer], and measurements were taken, but she decided not to trial it.  2.  Insomnia  -melatonin seemed to make tremor worse  -try trazodone, 25 mg - 50 mg at bed  3.  F/u 1 year.   Subjective:   Katie Hale was seen today in follow up for essential tremor.  My previous records were reviewed prior to todays visit.  Last visit, we discussed [employer] and patient was interested in trying that.  Measurements of her hand were obtained and she reports that she decided not to trial it.  Tremor is about the same.  [place]ang[place], she notes that melatonin makes her tremor worse the morning after she takes it.  She only takes 1.5 mg at bed.     Current prescribed movement disorder medications: Propranolol, 60 mg twice per day Xanax, 0.25 mg rarely   PREVIOUS MEDICATIONS: Topamax (higher dosages caused dizziness and lower dosages ineffective); primidone (patient did not find it effective at 100 mg twice per day and had side effects with higher dosages); xanax (sleepy)  ALLERGIES:   Allergies  Allergen Reactions   Topamax [Topiramate] Other (See Comments)    "out of body experience."  Intolerant.      CURRENT MEDICATIONS:  Outpatient Encounter Medications as of 08/12/2022  Medication Sig   Calcium Carb-Cholecalciferol (CALCIUM + D3) 600-200 MG-UNIT TABS Take 1 tablet by mouth 2 (two) times daily. Take one tablet two times daily.   Cholecalciferol (VITAMIN D3) 2000 UNITS TABS Take 2,000 Units by mouth daily.   Lactobacillus (PROBIOTIC ACIDOPHILUS PO) Take 1 tablet by mouth daily.   meloxicam (MOBIC) 15 MG tablet  Take 15 mg by mouth as needed for pain.   Multiple Vitamin (MULTIVITAMIN) tablet Take 1 tablet by mouth daily.   omeprazole (PRILOSEC) 40 MG capsule Take 1 capsule (40 mg total) by mouth every morning.   ondansetron (ZOFRAN) 4 MG tablet TAKE 1 TABLET(4 MG) BY MOUTH EVERY 8 HOURS AS NEEDED FOR NAUSEA OR VOMITING   propranolol (INDERAL) 60 MG tablet TAKE 1 TABLET(60 MG) BY MOUTH TWICE DAILY   RESTASIS 0.05 % ophthalmic emulsion    No facility-administered encounter medications on file as of 08/12/2022.     Objective:    PHYSICAL EXAMINATION:    VITALS:   There were no vitals filed for this visit.     GEN:  The patient appears stated age and is in NAD. HEENT:  Normocephalic, atraumatic.  The mucous membranes are moist. The superficial temporal arteries are without ropiness or tenderness. CV:  Loletha Grayer.  regular Lungs:  CTAB Neck/HEME:  There are no carotid bruits bilaterally.  Neurological examination:  Orientation: The patient is alert and oriented x3. Cranial nerves: There is good facial symmetry. The speech is fluent and clear. Soft palate rises symmetrically and there is no tongue deviation. Hearing is intact to conversational tone. Sensation: Sensation is intact to light touch throughout Motor: Strength is at least antigravity x4.  Movement examination: Tone: There is normal tone in the UE/LE Abnormal movements: rare LUE rest tremor.  There is a postural tremor bilaterally, mod, L>R.  She has chin tremor.  This is same as previous. Coordination:  There is no decremation with RAM's, with any form of RAMS, including alternating supination and pronation of the forearm, hand opening and closing, finger taps, heel taps and toe taps bilaterally. Gait and station:  normal stride length with good arm swing bilaterally  I have reviewed and interpreted the following labs independently   Chemistry      Component Value Date/Time   NA 142 06/26/2022 0743   K 4.1 06/26/2022 0743   CL  104 06/26/2022 0743   CO2 30 06/26/2022 0743   BUN 18 06/26/2022 0743   CREATININE 0.75 06/26/2022 0743      Component Value Date/Time   CALCIUM 9.2 06/26/2022 0743   ALKPHOS 87 06/26/2022 0743   AST 14 06/26/2022 0743   AST 17 04/11/2013 0000   ALT 10 06/26/2022 0743   BILITOT 0.4 06/26/2022 0743      Lab Results  Component Value Date   WBC 6.3 01/23/2022   HGB 13.6 01/23/2022   HCT 41.2 01/23/2022   MCV 95.4 01/23/2022   PLT 261 01/23/2022   Lab Results  Component Value Date   TSH 1.49 06/26/2022     Chemistry      Component Value Date/Time   NA 142 06/26/2022 0743   K 4.1 06/26/2022 0743   CL 104 06/26/2022 0743   CO2 30 06/26/2022 0743   BUN 18 06/26/2022 0743   CREATININE 0.75 06/26/2022 0743      Component Value Date/Time   CALCIUM 9.2 06/26/2022 0743   ALKPHOS 87 06/26/2022 0743   AST 14 06/26/2022 0743   AST 17 04/11/2013 0000   ALT 10 06/26/2022 0743   BILITOT 0.4 06/26/2022 0743         Total time spent on today's visit was *** minutes, including both face-to-face time and nonface-to-face time.  Time included that spent on review of records (prior notes available to me/labs/imaging if pertinent), discussing treatment and goals, answering patient's questions and coordinating care.  Cc:  Tonia Ghent, MD

## 2022-08-12 ENCOUNTER — Ambulatory Visit: Payer: Medicare PPO | Admitting: Neurology

## 2022-08-12 ENCOUNTER — Encounter: Payer: Self-pay | Admitting: Neurology

## 2022-08-12 VITALS — BP 120/72 | HR 51 | Ht 61.0 in | Wt 182.2 lb

## 2022-08-12 DIAGNOSIS — G250 Essential tremor: Secondary | ICD-10-CM | POA: Diagnosis not present

## 2022-08-12 NOTE — Patient Instructions (Signed)
You look good today!  The physicians and staff at [facility] are committed to providing excellent care. You may receive a survey requesting feedback about your experience at our office. We strive to receive "very good" responses to the survey questions. If you feel that your experience would prevent you from giving the office a "very good " response, please contact our office to try to remedy the situation. We may be reached at [phone]. Thank you for taking the time out of your busy day to complete the survey.

## 2022-08-20 IMAGING — MG MM DIGITAL SCREENING BILAT W/ TOMO AND CAD
8 series · 8 of 24 positions shown · non-contrast
Comparison: Previous exam(s).

CLINICAL DATA: Screening.

EXAM:
DIGITAL SCREENING BILATERAL MAMMOGRAM WITH TOMOSYNTHESIS AND CAD
TECHNIQUE: Bilateral screening digital craniocaudal and mediolateral oblique
mammograms were obtained. Bilateral screening digital breast
tomosynthesis was performed. The images were evaluated with
computer-aided detection.

[R CC synth-2D]
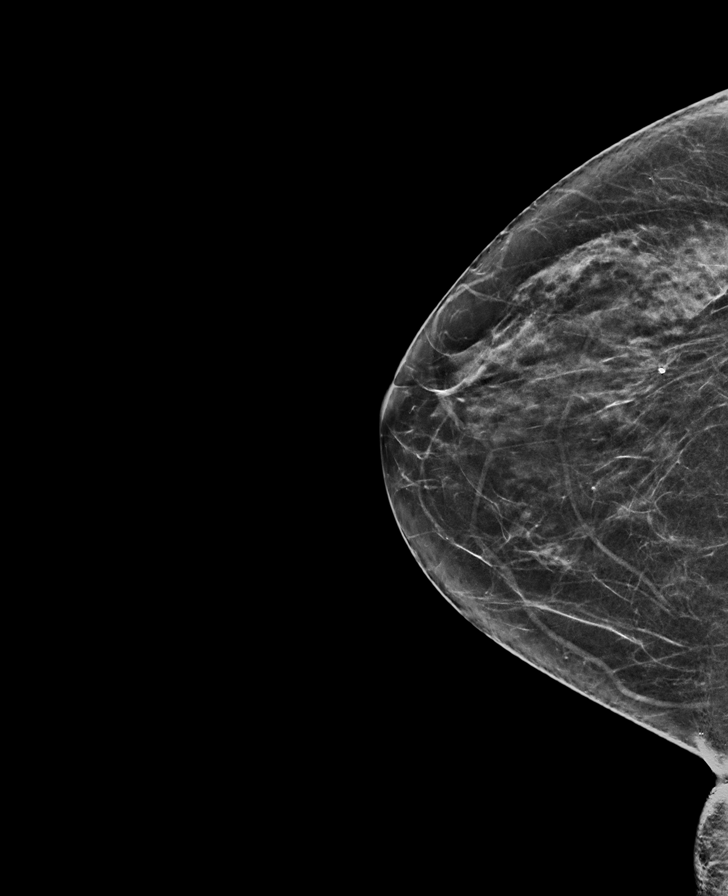

[L CC synth-2D]
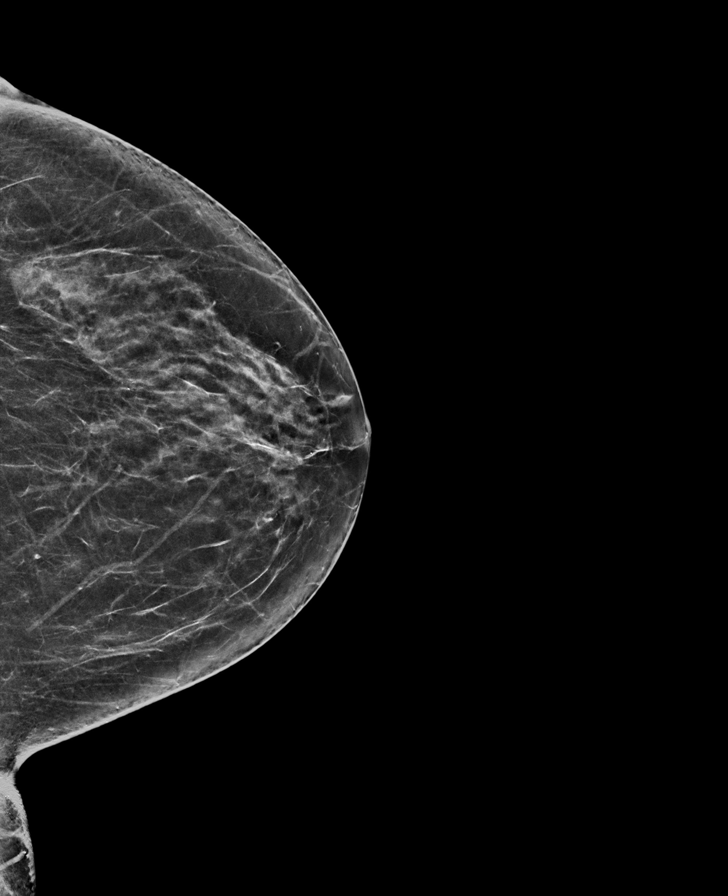

[L MLO synth-2D]
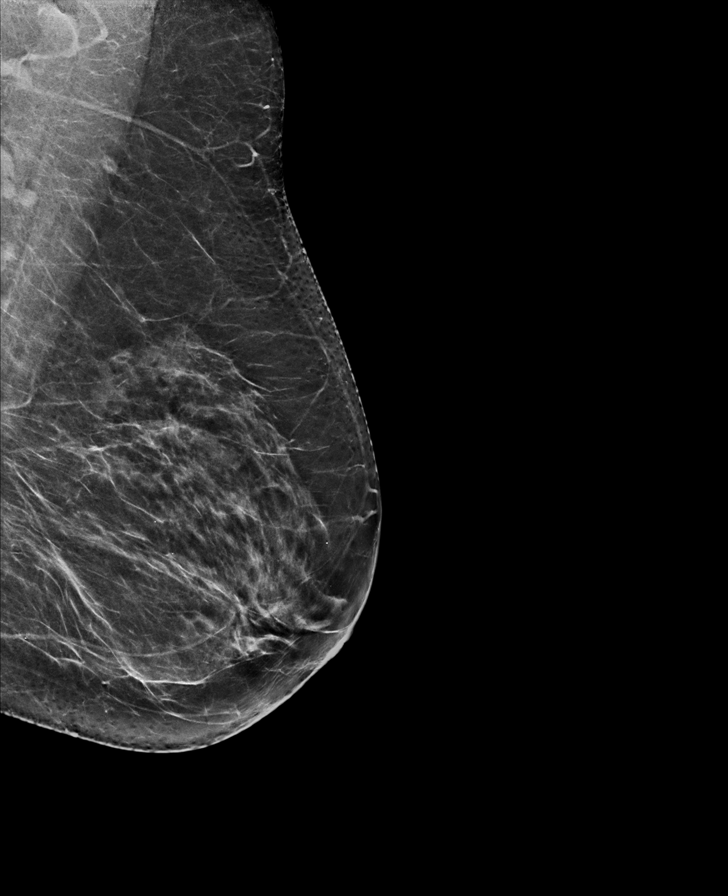

[R MLO synth-2D]
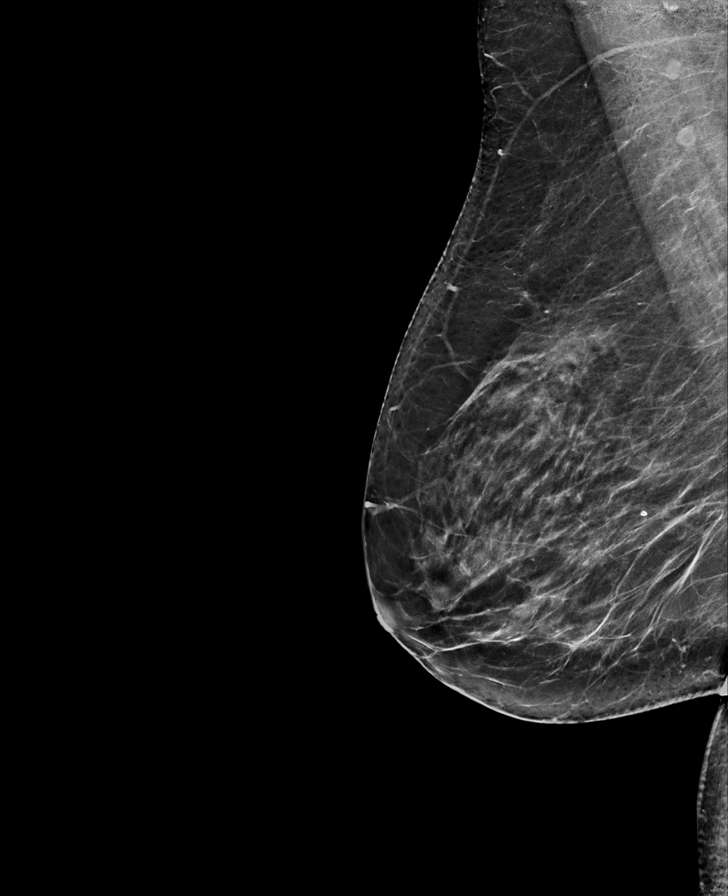

[L CC tomo · tomo slice 33/65.0]
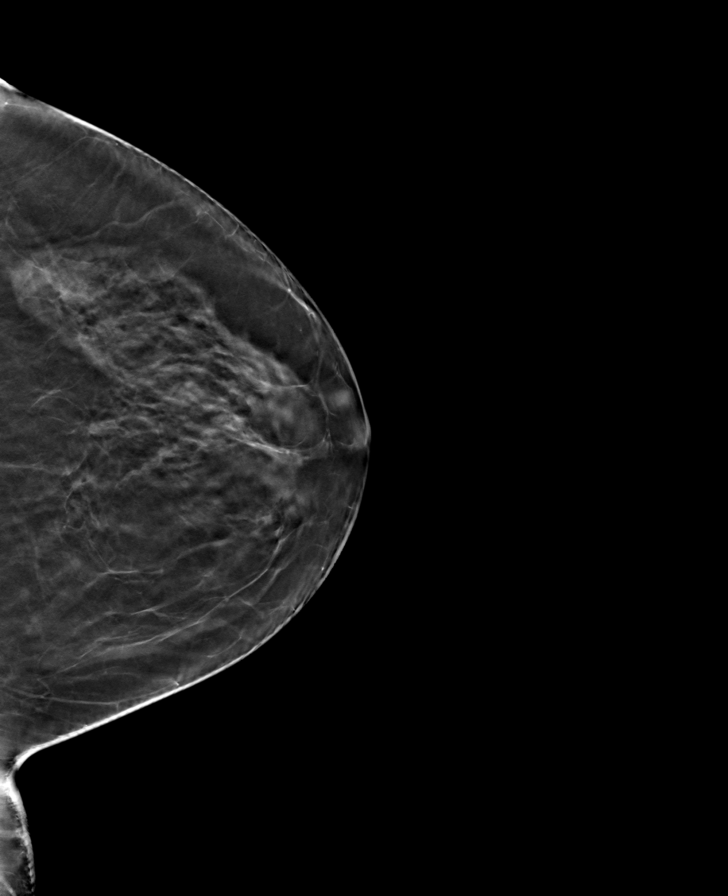

[R CC tomo · tomo slice 30/59.0]
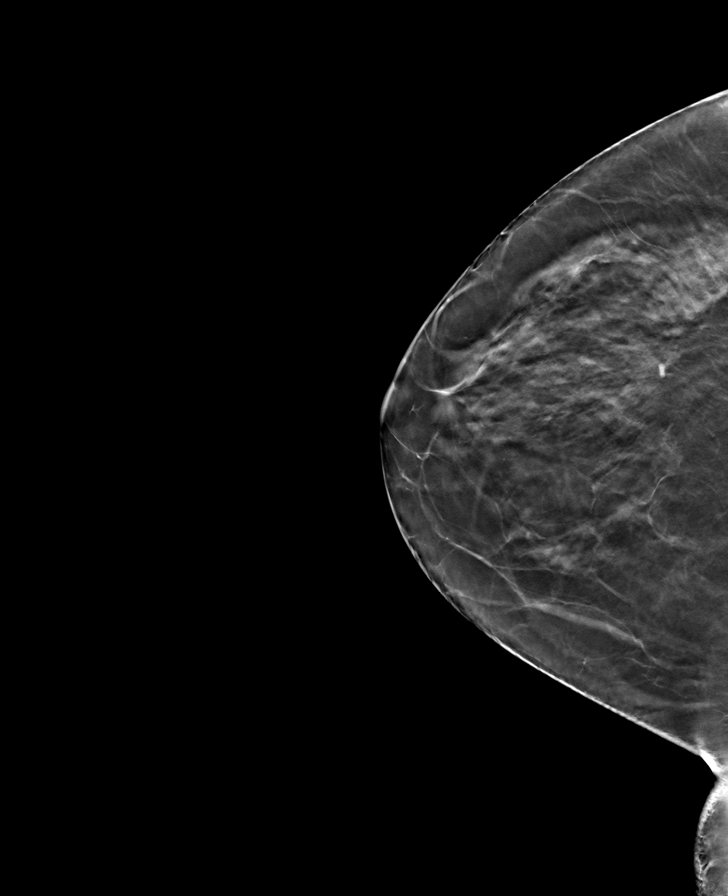

[R MLO tomo · tomo slice 35/70.0]
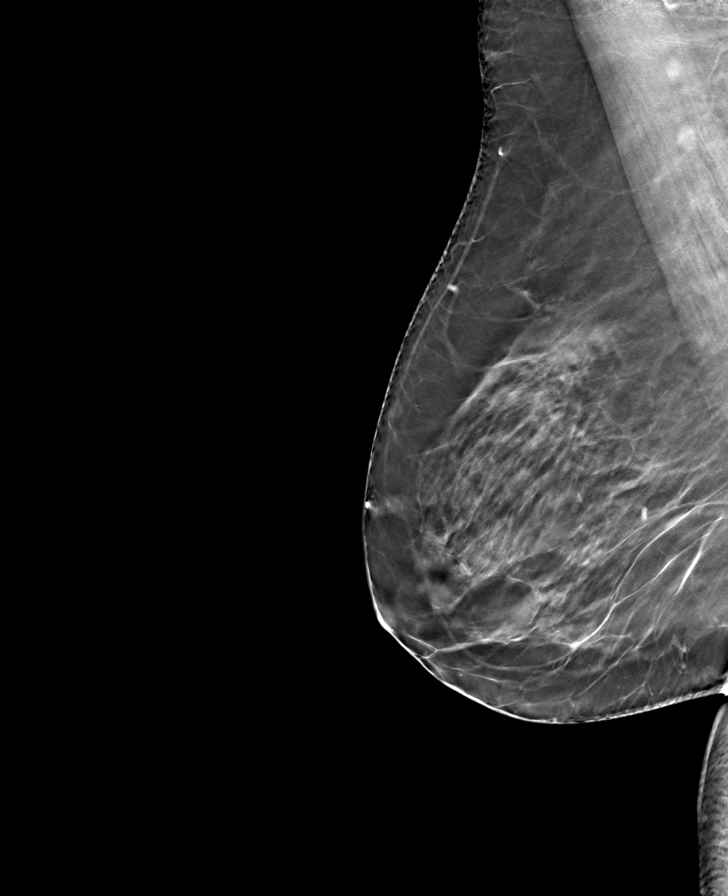

[L MLO tomo · tomo slice 36/71.0]
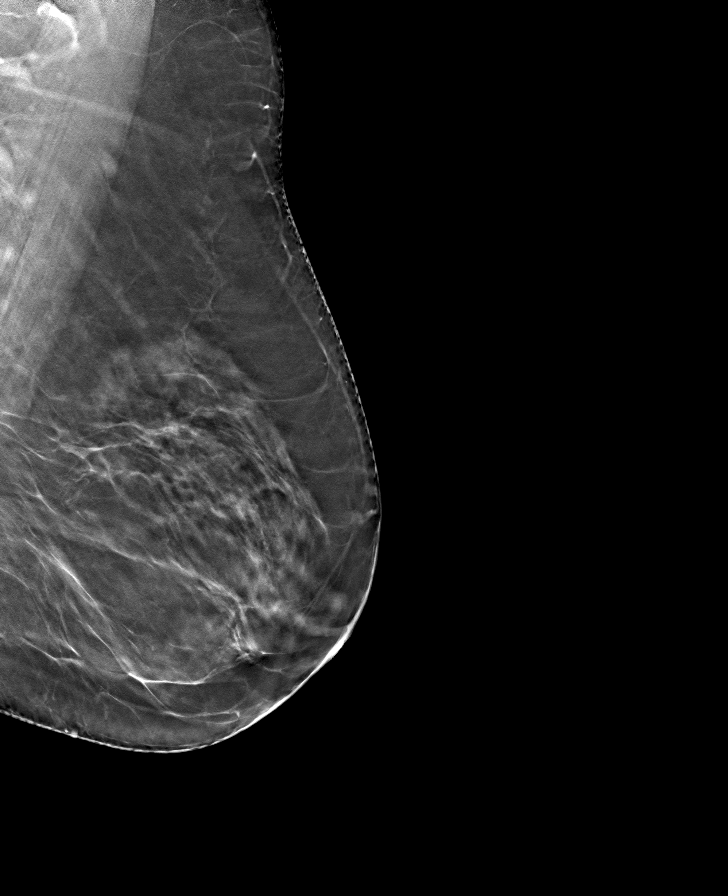

[8 of 24 positions shown; findings below may reference images not displayed]

ACR Breast Density Category b: There are scattered areas of
fibroglandular density.
FINDINGS: There are no findings suspicious for malignancy.
IMPRESSION: No mammographic evidence of malignancy. A result letter of this
screening mammogram will be mailed directly to the patient.

RECOMMENDATION:
Screening mammogram in one year. (Code:51-O-LD2)

BI-RADS CATEGORY  1: Negative.

## 2022-10-05 DIAGNOSIS — M1711 Unilateral primary osteoarthritis, right knee: Secondary | ICD-10-CM | POA: Diagnosis not present

## 2022-10-12 DIAGNOSIS — M1711 Unilateral primary osteoarthritis, right knee: Secondary | ICD-10-CM | POA: Diagnosis not present

## 2022-10-30 DIAGNOSIS — M25572 Pain in left ankle and joints of left foot: Secondary | ICD-10-CM | POA: Diagnosis not present

## 2022-11-05 DIAGNOSIS — M25572 Pain in left ankle and joints of left foot: Secondary | ICD-10-CM | POA: Diagnosis not present

## 2022-11-18 ENCOUNTER — Other Ambulatory Visit: Payer: Self-pay | Admitting: Family Medicine

## 2022-11-19 DIAGNOSIS — Z01818 Encounter for other preprocedural examination: Secondary | ICD-10-CM | POA: Diagnosis not present

## 2022-11-19 DIAGNOSIS — Z0189 Encounter for other specified special examinations: Secondary | ICD-10-CM | POA: Diagnosis not present

## 2022-11-26 DIAGNOSIS — G8918 Other acute postprocedural pain: Secondary | ICD-10-CM | POA: Diagnosis not present

## 2022-11-26 DIAGNOSIS — Z96659 Presence of unspecified artificial knee joint: Secondary | ICD-10-CM | POA: Diagnosis not present

## 2022-11-26 DIAGNOSIS — Z8261 Family history of arthritis: Secondary | ICD-10-CM | POA: Diagnosis not present

## 2022-11-26 DIAGNOSIS — M1711 Unilateral primary osteoarthritis, right knee: Secondary | ICD-10-CM | POA: Diagnosis not present

## 2022-11-26 DIAGNOSIS — K219 Gastro-esophageal reflux disease without esophagitis: Secondary | ICD-10-CM | POA: Diagnosis not present

## 2022-11-26 DIAGNOSIS — G250 Essential tremor: Secondary | ICD-10-CM | POA: Diagnosis not present

## 2022-11-27 DIAGNOSIS — M1711 Unilateral primary osteoarthritis, right knee: Secondary | ICD-10-CM | POA: Diagnosis not present

## 2022-11-27 DIAGNOSIS — K219 Gastro-esophageal reflux disease without esophagitis: Secondary | ICD-10-CM | POA: Diagnosis not present

## 2022-11-27 DIAGNOSIS — Z8261 Family history of arthritis: Secondary | ICD-10-CM | POA: Diagnosis not present

## 2022-11-27 DIAGNOSIS — Z96659 Presence of unspecified artificial knee joint: Secondary | ICD-10-CM | POA: Diagnosis not present

## 2022-11-27 DIAGNOSIS — G250 Essential tremor: Secondary | ICD-10-CM | POA: Diagnosis not present

## 2022-11-28 DIAGNOSIS — K219 Gastro-esophageal reflux disease without esophagitis: Secondary | ICD-10-CM | POA: Diagnosis not present

## 2022-11-28 DIAGNOSIS — M1711 Unilateral primary osteoarthritis, right knee: Secondary | ICD-10-CM | POA: Diagnosis not present

## 2022-11-28 DIAGNOSIS — G250 Essential tremor: Secondary | ICD-10-CM | POA: Diagnosis not present

## 2022-11-28 DIAGNOSIS — Z96659 Presence of unspecified artificial knee joint: Secondary | ICD-10-CM | POA: Diagnosis not present

## 2022-11-28 DIAGNOSIS — Z8261 Family history of arthritis: Secondary | ICD-10-CM | POA: Diagnosis not present

## 2022-12-02 DIAGNOSIS — Z96651 Presence of right artificial knee joint: Secondary | ICD-10-CM | POA: Diagnosis not present

## 2022-12-02 DIAGNOSIS — Z96659 Presence of unspecified artificial knee joint: Secondary | ICD-10-CM | POA: Diagnosis not present

## 2022-12-02 DIAGNOSIS — Z741 Need for assistance with personal care: Secondary | ICD-10-CM | POA: Diagnosis not present

## 2022-12-02 DIAGNOSIS — R278 Other lack of coordination: Secondary | ICD-10-CM | POA: Diagnosis not present

## 2022-12-02 DIAGNOSIS — M6281 Muscle weakness (generalized): Secondary | ICD-10-CM | POA: Diagnosis not present

## 2022-12-02 DIAGNOSIS — Z471 Aftercare following joint replacement surgery: Secondary | ICD-10-CM | POA: Diagnosis not present

## 2022-12-04 DIAGNOSIS — Z96659 Presence of unspecified artificial knee joint: Secondary | ICD-10-CM | POA: Diagnosis not present

## 2022-12-04 DIAGNOSIS — Z741 Need for assistance with personal care: Secondary | ICD-10-CM | POA: Diagnosis not present

## 2022-12-04 DIAGNOSIS — Z471 Aftercare following joint replacement surgery: Secondary | ICD-10-CM | POA: Diagnosis not present

## 2022-12-04 DIAGNOSIS — M6281 Muscle weakness (generalized): Secondary | ICD-10-CM | POA: Diagnosis not present

## 2022-12-04 DIAGNOSIS — Z96651 Presence of right artificial knee joint: Secondary | ICD-10-CM | POA: Diagnosis not present

## 2022-12-04 DIAGNOSIS — R278 Other lack of coordination: Secondary | ICD-10-CM | POA: Diagnosis not present

## 2022-12-07 DIAGNOSIS — M6281 Muscle weakness (generalized): Secondary | ICD-10-CM | POA: Diagnosis not present

## 2022-12-07 DIAGNOSIS — Z96659 Presence of unspecified artificial knee joint: Secondary | ICD-10-CM | POA: Diagnosis not present

## 2022-12-07 DIAGNOSIS — Z471 Aftercare following joint replacement surgery: Secondary | ICD-10-CM | POA: Diagnosis not present

## 2022-12-07 DIAGNOSIS — Z741 Need for assistance with personal care: Secondary | ICD-10-CM | POA: Diagnosis not present

## 2022-12-07 DIAGNOSIS — R278 Other lack of coordination: Secondary | ICD-10-CM | POA: Diagnosis not present

## 2022-12-07 DIAGNOSIS — Z96651 Presence of right artificial knee joint: Secondary | ICD-10-CM | POA: Diagnosis not present

## 2022-12-09 DIAGNOSIS — Z96651 Presence of right artificial knee joint: Secondary | ICD-10-CM | POA: Diagnosis not present

## 2022-12-09 DIAGNOSIS — Z741 Need for assistance with personal care: Secondary | ICD-10-CM | POA: Diagnosis not present

## 2022-12-09 DIAGNOSIS — Z96659 Presence of unspecified artificial knee joint: Secondary | ICD-10-CM | POA: Diagnosis not present

## 2022-12-09 DIAGNOSIS — R278 Other lack of coordination: Secondary | ICD-10-CM | POA: Diagnosis not present

## 2022-12-09 DIAGNOSIS — M6281 Muscle weakness (generalized): Secondary | ICD-10-CM | POA: Diagnosis not present

## 2022-12-09 DIAGNOSIS — Z471 Aftercare following joint replacement surgery: Secondary | ICD-10-CM | POA: Diagnosis not present

## 2022-12-11 DIAGNOSIS — Z741 Need for assistance with personal care: Secondary | ICD-10-CM | POA: Diagnosis not present

## 2022-12-11 DIAGNOSIS — Z96659 Presence of unspecified artificial knee joint: Secondary | ICD-10-CM | POA: Diagnosis not present

## 2022-12-11 DIAGNOSIS — Z96651 Presence of right artificial knee joint: Secondary | ICD-10-CM | POA: Diagnosis not present

## 2022-12-11 DIAGNOSIS — R278 Other lack of coordination: Secondary | ICD-10-CM | POA: Diagnosis not present

## 2022-12-11 DIAGNOSIS — Z471 Aftercare following joint replacement surgery: Secondary | ICD-10-CM | POA: Diagnosis not present

## 2022-12-11 DIAGNOSIS — M6281 Muscle weakness (generalized): Secondary | ICD-10-CM | POA: Diagnosis not present

## 2022-12-14 DIAGNOSIS — Z741 Need for assistance with personal care: Secondary | ICD-10-CM | POA: Diagnosis not present

## 2022-12-14 DIAGNOSIS — R278 Other lack of coordination: Secondary | ICD-10-CM | POA: Diagnosis not present

## 2022-12-14 DIAGNOSIS — Z96651 Presence of right artificial knee joint: Secondary | ICD-10-CM | POA: Diagnosis not present

## 2022-12-14 DIAGNOSIS — M6281 Muscle weakness (generalized): Secondary | ICD-10-CM | POA: Diagnosis not present

## 2022-12-14 DIAGNOSIS — Z96659 Presence of unspecified artificial knee joint: Secondary | ICD-10-CM | POA: Diagnosis not present

## 2022-12-14 DIAGNOSIS — Z471 Aftercare following joint replacement surgery: Secondary | ICD-10-CM | POA: Diagnosis not present

## 2022-12-16 DIAGNOSIS — Z96651 Presence of right artificial knee joint: Secondary | ICD-10-CM | POA: Diagnosis not present

## 2022-12-16 DIAGNOSIS — Z96659 Presence of unspecified artificial knee joint: Secondary | ICD-10-CM | POA: Diagnosis not present

## 2022-12-16 DIAGNOSIS — Z741 Need for assistance with personal care: Secondary | ICD-10-CM | POA: Diagnosis not present

## 2022-12-16 DIAGNOSIS — R278 Other lack of coordination: Secondary | ICD-10-CM | POA: Diagnosis not present

## 2022-12-16 DIAGNOSIS — Z471 Aftercare following joint replacement surgery: Secondary | ICD-10-CM | POA: Diagnosis not present

## 2022-12-16 DIAGNOSIS — M6281 Muscle weakness (generalized): Secondary | ICD-10-CM | POA: Diagnosis not present

## 2022-12-18 DIAGNOSIS — Z741 Need for assistance with personal care: Secondary | ICD-10-CM | POA: Diagnosis not present

## 2022-12-18 DIAGNOSIS — Z471 Aftercare following joint replacement surgery: Secondary | ICD-10-CM | POA: Diagnosis not present

## 2022-12-18 DIAGNOSIS — Z96651 Presence of right artificial knee joint: Secondary | ICD-10-CM | POA: Diagnosis not present

## 2022-12-18 DIAGNOSIS — R278 Other lack of coordination: Secondary | ICD-10-CM | POA: Diagnosis not present

## 2022-12-18 DIAGNOSIS — Z96659 Presence of unspecified artificial knee joint: Secondary | ICD-10-CM | POA: Diagnosis not present

## 2022-12-18 DIAGNOSIS — M6281 Muscle weakness (generalized): Secondary | ICD-10-CM | POA: Diagnosis not present

## 2022-12-21 DIAGNOSIS — Z741 Need for assistance with personal care: Secondary | ICD-10-CM | POA: Diagnosis not present

## 2022-12-21 DIAGNOSIS — R278 Other lack of coordination: Secondary | ICD-10-CM | POA: Diagnosis not present

## 2022-12-21 DIAGNOSIS — Z471 Aftercare following joint replacement surgery: Secondary | ICD-10-CM | POA: Diagnosis not present

## 2022-12-21 DIAGNOSIS — Z96651 Presence of right artificial knee joint: Secondary | ICD-10-CM | POA: Diagnosis not present

## 2022-12-21 DIAGNOSIS — Z96659 Presence of unspecified artificial knee joint: Secondary | ICD-10-CM | POA: Diagnosis not present

## 2022-12-21 DIAGNOSIS — M6281 Muscle weakness (generalized): Secondary | ICD-10-CM | POA: Diagnosis not present

## 2022-12-23 DIAGNOSIS — R278 Other lack of coordination: Secondary | ICD-10-CM | POA: Diagnosis not present

## 2022-12-23 DIAGNOSIS — M6281 Muscle weakness (generalized): Secondary | ICD-10-CM | POA: Diagnosis not present

## 2022-12-23 DIAGNOSIS — Z96659 Presence of unspecified artificial knee joint: Secondary | ICD-10-CM | POA: Diagnosis not present

## 2022-12-23 DIAGNOSIS — Z96651 Presence of right artificial knee joint: Secondary | ICD-10-CM | POA: Diagnosis not present

## 2022-12-23 DIAGNOSIS — Z471 Aftercare following joint replacement surgery: Secondary | ICD-10-CM | POA: Diagnosis not present

## 2022-12-23 DIAGNOSIS — Z741 Need for assistance with personal care: Secondary | ICD-10-CM | POA: Diagnosis not present

## 2022-12-25 DIAGNOSIS — Z741 Need for assistance with personal care: Secondary | ICD-10-CM | POA: Diagnosis not present

## 2022-12-25 DIAGNOSIS — M6281 Muscle weakness (generalized): Secondary | ICD-10-CM | POA: Diagnosis not present

## 2022-12-25 DIAGNOSIS — Z471 Aftercare following joint replacement surgery: Secondary | ICD-10-CM | POA: Diagnosis not present

## 2022-12-25 DIAGNOSIS — Z96651 Presence of right artificial knee joint: Secondary | ICD-10-CM | POA: Diagnosis not present

## 2022-12-25 DIAGNOSIS — Z96659 Presence of unspecified artificial knee joint: Secondary | ICD-10-CM | POA: Diagnosis not present

## 2022-12-25 DIAGNOSIS — R278 Other lack of coordination: Secondary | ICD-10-CM | POA: Diagnosis not present

## 2022-12-28 DIAGNOSIS — Z96659 Presence of unspecified artificial knee joint: Secondary | ICD-10-CM | POA: Diagnosis not present

## 2022-12-28 DIAGNOSIS — M6281 Muscle weakness (generalized): Secondary | ICD-10-CM | POA: Diagnosis not present

## 2022-12-28 DIAGNOSIS — Z471 Aftercare following joint replacement surgery: Secondary | ICD-10-CM | POA: Diagnosis not present

## 2022-12-28 DIAGNOSIS — Z96651 Presence of right artificial knee joint: Secondary | ICD-10-CM | POA: Diagnosis not present

## 2022-12-28 DIAGNOSIS — R278 Other lack of coordination: Secondary | ICD-10-CM | POA: Diagnosis not present

## 2022-12-28 DIAGNOSIS — Z741 Need for assistance with personal care: Secondary | ICD-10-CM | POA: Diagnosis not present

## 2022-12-30 DIAGNOSIS — Z96659 Presence of unspecified artificial knee joint: Secondary | ICD-10-CM | POA: Diagnosis not present

## 2022-12-30 DIAGNOSIS — Z471 Aftercare following joint replacement surgery: Secondary | ICD-10-CM | POA: Diagnosis not present

## 2022-12-30 DIAGNOSIS — Z96651 Presence of right artificial knee joint: Secondary | ICD-10-CM | POA: Diagnosis not present

## 2022-12-30 DIAGNOSIS — R278 Other lack of coordination: Secondary | ICD-10-CM | POA: Diagnosis not present

## 2022-12-30 DIAGNOSIS — Z741 Need for assistance with personal care: Secondary | ICD-10-CM | POA: Diagnosis not present

## 2022-12-30 DIAGNOSIS — M6281 Muscle weakness (generalized): Secondary | ICD-10-CM | POA: Diagnosis not present

## 2023-01-01 DIAGNOSIS — M6281 Muscle weakness (generalized): Secondary | ICD-10-CM | POA: Diagnosis not present

## 2023-01-01 DIAGNOSIS — Z96651 Presence of right artificial knee joint: Secondary | ICD-10-CM | POA: Diagnosis not present

## 2023-01-01 DIAGNOSIS — Z96659 Presence of unspecified artificial knee joint: Secondary | ICD-10-CM | POA: Diagnosis not present

## 2023-01-01 DIAGNOSIS — Z741 Need for assistance with personal care: Secondary | ICD-10-CM | POA: Diagnosis not present

## 2023-01-01 DIAGNOSIS — R278 Other lack of coordination: Secondary | ICD-10-CM | POA: Diagnosis not present

## 2023-01-01 DIAGNOSIS — Z471 Aftercare following joint replacement surgery: Secondary | ICD-10-CM | POA: Diagnosis not present

## 2023-01-04 DIAGNOSIS — M6281 Muscle weakness (generalized): Secondary | ICD-10-CM | POA: Diagnosis not present

## 2023-01-04 DIAGNOSIS — Z96659 Presence of unspecified artificial knee joint: Secondary | ICD-10-CM | POA: Diagnosis not present

## 2023-01-04 DIAGNOSIS — Z96651 Presence of right artificial knee joint: Secondary | ICD-10-CM | POA: Diagnosis not present

## 2023-01-04 DIAGNOSIS — Z741 Need for assistance with personal care: Secondary | ICD-10-CM | POA: Diagnosis not present

## 2023-01-04 DIAGNOSIS — Z471 Aftercare following joint replacement surgery: Secondary | ICD-10-CM | POA: Diagnosis not present

## 2023-01-04 DIAGNOSIS — R278 Other lack of coordination: Secondary | ICD-10-CM | POA: Diagnosis not present

## 2023-01-06 DIAGNOSIS — Z96651 Presence of right artificial knee joint: Secondary | ICD-10-CM | POA: Diagnosis not present

## 2023-01-06 DIAGNOSIS — M6281 Muscle weakness (generalized): Secondary | ICD-10-CM | POA: Diagnosis not present

## 2023-01-06 DIAGNOSIS — Z96659 Presence of unspecified artificial knee joint: Secondary | ICD-10-CM | POA: Diagnosis not present

## 2023-01-06 DIAGNOSIS — Z471 Aftercare following joint replacement surgery: Secondary | ICD-10-CM | POA: Diagnosis not present

## 2023-01-06 DIAGNOSIS — Z741 Need for assistance with personal care: Secondary | ICD-10-CM | POA: Diagnosis not present

## 2023-01-06 DIAGNOSIS — R278 Other lack of coordination: Secondary | ICD-10-CM | POA: Diagnosis not present

## 2023-01-08 DIAGNOSIS — M76822 Posterior tibial tendinitis, left leg: Secondary | ICD-10-CM | POA: Diagnosis not present

## 2023-01-11 DIAGNOSIS — Z741 Need for assistance with personal care: Secondary | ICD-10-CM | POA: Diagnosis not present

## 2023-01-11 DIAGNOSIS — M6281 Muscle weakness (generalized): Secondary | ICD-10-CM | POA: Diagnosis not present

## 2023-01-11 DIAGNOSIS — Z96659 Presence of unspecified artificial knee joint: Secondary | ICD-10-CM | POA: Diagnosis not present

## 2023-01-11 DIAGNOSIS — R278 Other lack of coordination: Secondary | ICD-10-CM | POA: Diagnosis not present

## 2023-01-11 DIAGNOSIS — Z471 Aftercare following joint replacement surgery: Secondary | ICD-10-CM | POA: Diagnosis not present

## 2023-01-11 DIAGNOSIS — Z96651 Presence of right artificial knee joint: Secondary | ICD-10-CM | POA: Diagnosis not present

## 2023-01-13 DIAGNOSIS — Z741 Need for assistance with personal care: Secondary | ICD-10-CM | POA: Diagnosis not present

## 2023-01-13 DIAGNOSIS — Z96651 Presence of right artificial knee joint: Secondary | ICD-10-CM | POA: Diagnosis not present

## 2023-01-13 DIAGNOSIS — Z471 Aftercare following joint replacement surgery: Secondary | ICD-10-CM | POA: Diagnosis not present

## 2023-01-13 DIAGNOSIS — Z96659 Presence of unspecified artificial knee joint: Secondary | ICD-10-CM | POA: Diagnosis not present

## 2023-01-13 DIAGNOSIS — R278 Other lack of coordination: Secondary | ICD-10-CM | POA: Diagnosis not present

## 2023-01-13 DIAGNOSIS — M6281 Muscle weakness (generalized): Secondary | ICD-10-CM | POA: Diagnosis not present

## 2023-01-15 DIAGNOSIS — Z96651 Presence of right artificial knee joint: Secondary | ICD-10-CM | POA: Diagnosis not present

## 2023-01-15 DIAGNOSIS — Z741 Need for assistance with personal care: Secondary | ICD-10-CM | POA: Diagnosis not present

## 2023-01-15 DIAGNOSIS — Z471 Aftercare following joint replacement surgery: Secondary | ICD-10-CM | POA: Diagnosis not present

## 2023-01-15 DIAGNOSIS — R278 Other lack of coordination: Secondary | ICD-10-CM | POA: Diagnosis not present

## 2023-01-15 DIAGNOSIS — Z96659 Presence of unspecified artificial knee joint: Secondary | ICD-10-CM | POA: Diagnosis not present

## 2023-01-15 DIAGNOSIS — M6281 Muscle weakness (generalized): Secondary | ICD-10-CM | POA: Diagnosis not present

## 2023-01-18 DIAGNOSIS — Z741 Need for assistance with personal care: Secondary | ICD-10-CM | POA: Diagnosis not present

## 2023-01-18 DIAGNOSIS — Z96651 Presence of right artificial knee joint: Secondary | ICD-10-CM | POA: Diagnosis not present

## 2023-01-18 DIAGNOSIS — R278 Other lack of coordination: Secondary | ICD-10-CM | POA: Diagnosis not present

## 2023-01-18 DIAGNOSIS — Z471 Aftercare following joint replacement surgery: Secondary | ICD-10-CM | POA: Diagnosis not present

## 2023-01-18 DIAGNOSIS — Z96659 Presence of unspecified artificial knee joint: Secondary | ICD-10-CM | POA: Diagnosis not present

## 2023-01-18 DIAGNOSIS — M6281 Muscle weakness (generalized): Secondary | ICD-10-CM | POA: Diagnosis not present

## 2023-01-20 DIAGNOSIS — Z471 Aftercare following joint replacement surgery: Secondary | ICD-10-CM | POA: Diagnosis not present

## 2023-01-20 DIAGNOSIS — Z741 Need for assistance with personal care: Secondary | ICD-10-CM | POA: Diagnosis not present

## 2023-01-20 DIAGNOSIS — Z96659 Presence of unspecified artificial knee joint: Secondary | ICD-10-CM | POA: Diagnosis not present

## 2023-01-20 DIAGNOSIS — R278 Other lack of coordination: Secondary | ICD-10-CM | POA: Diagnosis not present

## 2023-01-20 DIAGNOSIS — Z96651 Presence of right artificial knee joint: Secondary | ICD-10-CM | POA: Diagnosis not present

## 2023-01-20 DIAGNOSIS — M6281 Muscle weakness (generalized): Secondary | ICD-10-CM | POA: Diagnosis not present

## 2023-01-22 DIAGNOSIS — Z741 Need for assistance with personal care: Secondary | ICD-10-CM | POA: Diagnosis not present

## 2023-01-22 DIAGNOSIS — M6281 Muscle weakness (generalized): Secondary | ICD-10-CM | POA: Diagnosis not present

## 2023-01-22 DIAGNOSIS — Z96659 Presence of unspecified artificial knee joint: Secondary | ICD-10-CM | POA: Diagnosis not present

## 2023-01-22 DIAGNOSIS — R278 Other lack of coordination: Secondary | ICD-10-CM | POA: Diagnosis not present

## 2023-01-22 DIAGNOSIS — Z471 Aftercare following joint replacement surgery: Secondary | ICD-10-CM | POA: Diagnosis not present

## 2023-01-22 DIAGNOSIS — Z96651 Presence of right artificial knee joint: Secondary | ICD-10-CM | POA: Diagnosis not present

## 2023-01-25 DIAGNOSIS — R278 Other lack of coordination: Secondary | ICD-10-CM | POA: Diagnosis not present

## 2023-01-25 DIAGNOSIS — Z471 Aftercare following joint replacement surgery: Secondary | ICD-10-CM | POA: Diagnosis not present

## 2023-01-25 DIAGNOSIS — Z96651 Presence of right artificial knee joint: Secondary | ICD-10-CM | POA: Diagnosis not present

## 2023-01-25 DIAGNOSIS — M6281 Muscle weakness (generalized): Secondary | ICD-10-CM | POA: Diagnosis not present

## 2023-01-25 DIAGNOSIS — Z741 Need for assistance with personal care: Secondary | ICD-10-CM | POA: Diagnosis not present

## 2023-01-25 DIAGNOSIS — Z96659 Presence of unspecified artificial knee joint: Secondary | ICD-10-CM | POA: Diagnosis not present

## 2023-01-27 DIAGNOSIS — R278 Other lack of coordination: Secondary | ICD-10-CM | POA: Diagnosis not present

## 2023-01-27 DIAGNOSIS — Z96659 Presence of unspecified artificial knee joint: Secondary | ICD-10-CM | POA: Diagnosis not present

## 2023-01-27 DIAGNOSIS — Z741 Need for assistance with personal care: Secondary | ICD-10-CM | POA: Diagnosis not present

## 2023-01-27 DIAGNOSIS — Z96651 Presence of right artificial knee joint: Secondary | ICD-10-CM | POA: Diagnosis not present

## 2023-01-27 DIAGNOSIS — Z471 Aftercare following joint replacement surgery: Secondary | ICD-10-CM | POA: Diagnosis not present

## 2023-01-27 DIAGNOSIS — M6281 Muscle weakness (generalized): Secondary | ICD-10-CM | POA: Diagnosis not present

## 2023-01-29 DIAGNOSIS — R278 Other lack of coordination: Secondary | ICD-10-CM | POA: Diagnosis not present

## 2023-01-29 DIAGNOSIS — Z96659 Presence of unspecified artificial knee joint: Secondary | ICD-10-CM | POA: Diagnosis not present

## 2023-01-29 DIAGNOSIS — Z741 Need for assistance with personal care: Secondary | ICD-10-CM | POA: Diagnosis not present

## 2023-01-29 DIAGNOSIS — Z471 Aftercare following joint replacement surgery: Secondary | ICD-10-CM | POA: Diagnosis not present

## 2023-01-29 DIAGNOSIS — Z96651 Presence of right artificial knee joint: Secondary | ICD-10-CM | POA: Diagnosis not present

## 2023-01-29 DIAGNOSIS — M6281 Muscle weakness (generalized): Secondary | ICD-10-CM | POA: Diagnosis not present

## 2023-02-01 DIAGNOSIS — M6281 Muscle weakness (generalized): Secondary | ICD-10-CM | POA: Diagnosis not present

## 2023-02-01 DIAGNOSIS — Z741 Need for assistance with personal care: Secondary | ICD-10-CM | POA: Diagnosis not present

## 2023-02-01 DIAGNOSIS — Z96651 Presence of right artificial knee joint: Secondary | ICD-10-CM | POA: Diagnosis not present

## 2023-02-01 DIAGNOSIS — Z471 Aftercare following joint replacement surgery: Secondary | ICD-10-CM | POA: Diagnosis not present

## 2023-02-01 DIAGNOSIS — Z96659 Presence of unspecified artificial knee joint: Secondary | ICD-10-CM | POA: Diagnosis not present

## 2023-02-01 DIAGNOSIS — R278 Other lack of coordination: Secondary | ICD-10-CM | POA: Diagnosis not present

## 2023-02-03 DIAGNOSIS — Z96659 Presence of unspecified artificial knee joint: Secondary | ICD-10-CM | POA: Diagnosis not present

## 2023-02-03 DIAGNOSIS — M6281 Muscle weakness (generalized): Secondary | ICD-10-CM | POA: Diagnosis not present

## 2023-02-03 DIAGNOSIS — Z471 Aftercare following joint replacement surgery: Secondary | ICD-10-CM | POA: Diagnosis not present

## 2023-02-03 DIAGNOSIS — R278 Other lack of coordination: Secondary | ICD-10-CM | POA: Diagnosis not present

## 2023-02-03 DIAGNOSIS — Z96651 Presence of right artificial knee joint: Secondary | ICD-10-CM | POA: Diagnosis not present

## 2023-02-03 DIAGNOSIS — Z741 Need for assistance with personal care: Secondary | ICD-10-CM | POA: Diagnosis not present

## 2023-02-16 DIAGNOSIS — R278 Other lack of coordination: Secondary | ICD-10-CM | POA: Diagnosis not present

## 2023-02-16 DIAGNOSIS — M79672 Pain in left foot: Secondary | ICD-10-CM | POA: Diagnosis not present

## 2023-02-16 DIAGNOSIS — Z471 Aftercare following joint replacement surgery: Secondary | ICD-10-CM | POA: Diagnosis not present

## 2023-02-16 DIAGNOSIS — Z96651 Presence of right artificial knee joint: Secondary | ICD-10-CM | POA: Diagnosis not present

## 2023-02-16 DIAGNOSIS — Z741 Need for assistance with personal care: Secondary | ICD-10-CM | POA: Diagnosis not present

## 2023-02-16 DIAGNOSIS — M6281 Muscle weakness (generalized): Secondary | ICD-10-CM | POA: Diagnosis not present

## 2023-02-16 DIAGNOSIS — M76822 Posterior tibial tendinitis, left leg: Secondary | ICD-10-CM | POA: Diagnosis not present

## 2023-02-18 DIAGNOSIS — R278 Other lack of coordination: Secondary | ICD-10-CM | POA: Diagnosis not present

## 2023-02-18 DIAGNOSIS — M76822 Posterior tibial tendinitis, left leg: Secondary | ICD-10-CM | POA: Diagnosis not present

## 2023-02-18 DIAGNOSIS — M79672 Pain in left foot: Secondary | ICD-10-CM | POA: Diagnosis not present

## 2023-02-18 DIAGNOSIS — Z96651 Presence of right artificial knee joint: Secondary | ICD-10-CM | POA: Diagnosis not present

## 2023-02-18 DIAGNOSIS — Z741 Need for assistance with personal care: Secondary | ICD-10-CM | POA: Diagnosis not present

## 2023-02-18 DIAGNOSIS — M6281 Muscle weakness (generalized): Secondary | ICD-10-CM | POA: Diagnosis not present

## 2023-02-18 DIAGNOSIS — Z471 Aftercare following joint replacement surgery: Secondary | ICD-10-CM | POA: Diagnosis not present

## 2023-02-23 DIAGNOSIS — Z741 Need for assistance with personal care: Secondary | ICD-10-CM | POA: Diagnosis not present

## 2023-02-23 DIAGNOSIS — R278 Other lack of coordination: Secondary | ICD-10-CM | POA: Diagnosis not present

## 2023-02-23 DIAGNOSIS — Z471 Aftercare following joint replacement surgery: Secondary | ICD-10-CM | POA: Diagnosis not present

## 2023-02-23 DIAGNOSIS — M79672 Pain in left foot: Secondary | ICD-10-CM | POA: Diagnosis not present

## 2023-02-23 DIAGNOSIS — M6281 Muscle weakness (generalized): Secondary | ICD-10-CM | POA: Diagnosis not present

## 2023-02-23 DIAGNOSIS — Z96651 Presence of right artificial knee joint: Secondary | ICD-10-CM | POA: Diagnosis not present

## 2023-02-23 DIAGNOSIS — M76822 Posterior tibial tendinitis, left leg: Secondary | ICD-10-CM | POA: Diagnosis not present

## 2023-02-25 DIAGNOSIS — Z741 Need for assistance with personal care: Secondary | ICD-10-CM | POA: Diagnosis not present

## 2023-02-25 DIAGNOSIS — Z471 Aftercare following joint replacement surgery: Secondary | ICD-10-CM | POA: Diagnosis not present

## 2023-02-25 DIAGNOSIS — M76822 Posterior tibial tendinitis, left leg: Secondary | ICD-10-CM | POA: Diagnosis not present

## 2023-02-25 DIAGNOSIS — Z96651 Presence of right artificial knee joint: Secondary | ICD-10-CM | POA: Diagnosis not present

## 2023-02-25 DIAGNOSIS — M6281 Muscle weakness (generalized): Secondary | ICD-10-CM | POA: Diagnosis not present

## 2023-02-25 DIAGNOSIS — R278 Other lack of coordination: Secondary | ICD-10-CM | POA: Diagnosis not present

## 2023-02-25 DIAGNOSIS — M79672 Pain in left foot: Secondary | ICD-10-CM | POA: Diagnosis not present

## 2023-03-04 DIAGNOSIS — M6281 Muscle weakness (generalized): Secondary | ICD-10-CM | POA: Diagnosis not present

## 2023-03-04 DIAGNOSIS — R278 Other lack of coordination: Secondary | ICD-10-CM | POA: Diagnosis not present

## 2023-03-04 DIAGNOSIS — M76822 Posterior tibial tendinitis, left leg: Secondary | ICD-10-CM | POA: Diagnosis not present

## 2023-03-04 DIAGNOSIS — Z96651 Presence of right artificial knee joint: Secondary | ICD-10-CM | POA: Diagnosis not present

## 2023-03-04 DIAGNOSIS — Z741 Need for assistance with personal care: Secondary | ICD-10-CM | POA: Diagnosis not present

## 2023-03-04 DIAGNOSIS — M79672 Pain in left foot: Secondary | ICD-10-CM | POA: Diagnosis not present

## 2023-03-04 DIAGNOSIS — Z471 Aftercare following joint replacement surgery: Secondary | ICD-10-CM | POA: Diagnosis not present

## 2023-03-08 DIAGNOSIS — M76822 Posterior tibial tendinitis, left leg: Secondary | ICD-10-CM | POA: Diagnosis not present

## 2023-03-08 DIAGNOSIS — Z741 Need for assistance with personal care: Secondary | ICD-10-CM | POA: Diagnosis not present

## 2023-03-08 DIAGNOSIS — Z471 Aftercare following joint replacement surgery: Secondary | ICD-10-CM | POA: Diagnosis not present

## 2023-03-08 DIAGNOSIS — Z96651 Presence of right artificial knee joint: Secondary | ICD-10-CM | POA: Diagnosis not present

## 2023-03-08 DIAGNOSIS — M6281 Muscle weakness (generalized): Secondary | ICD-10-CM | POA: Diagnosis not present

## 2023-03-08 DIAGNOSIS — R278 Other lack of coordination: Secondary | ICD-10-CM | POA: Diagnosis not present

## 2023-03-08 DIAGNOSIS — M79672 Pain in left foot: Secondary | ICD-10-CM | POA: Diagnosis not present

## 2023-03-11 DIAGNOSIS — M6281 Muscle weakness (generalized): Secondary | ICD-10-CM | POA: Diagnosis not present

## 2023-03-11 DIAGNOSIS — M79672 Pain in left foot: Secondary | ICD-10-CM | POA: Diagnosis not present

## 2023-03-11 DIAGNOSIS — R278 Other lack of coordination: Secondary | ICD-10-CM | POA: Diagnosis not present

## 2023-03-11 DIAGNOSIS — Z471 Aftercare following joint replacement surgery: Secondary | ICD-10-CM | POA: Diagnosis not present

## 2023-03-11 DIAGNOSIS — M76822 Posterior tibial tendinitis, left leg: Secondary | ICD-10-CM | POA: Diagnosis not present

## 2023-03-11 DIAGNOSIS — Z96651 Presence of right artificial knee joint: Secondary | ICD-10-CM | POA: Diagnosis not present

## 2023-03-11 DIAGNOSIS — Z741 Need for assistance with personal care: Secondary | ICD-10-CM | POA: Diagnosis not present

## 2023-03-16 DIAGNOSIS — M76822 Posterior tibial tendinitis, left leg: Secondary | ICD-10-CM | POA: Diagnosis not present

## 2023-03-16 DIAGNOSIS — Z741 Need for assistance with personal care: Secondary | ICD-10-CM | POA: Diagnosis not present

## 2023-03-16 DIAGNOSIS — R278 Other lack of coordination: Secondary | ICD-10-CM | POA: Diagnosis not present

## 2023-03-16 DIAGNOSIS — M79672 Pain in left foot: Secondary | ICD-10-CM | POA: Diagnosis not present

## 2023-03-16 DIAGNOSIS — M6281 Muscle weakness (generalized): Secondary | ICD-10-CM | POA: Diagnosis not present

## 2023-03-16 DIAGNOSIS — Z471 Aftercare following joint replacement surgery: Secondary | ICD-10-CM | POA: Diagnosis not present

## 2023-03-16 DIAGNOSIS — Z96651 Presence of right artificial knee joint: Secondary | ICD-10-CM | POA: Diagnosis not present

## 2023-03-18 DIAGNOSIS — Z471 Aftercare following joint replacement surgery: Secondary | ICD-10-CM | POA: Diagnosis not present

## 2023-03-18 DIAGNOSIS — R278 Other lack of coordination: Secondary | ICD-10-CM | POA: Diagnosis not present

## 2023-03-18 DIAGNOSIS — M76822 Posterior tibial tendinitis, left leg: Secondary | ICD-10-CM | POA: Diagnosis not present

## 2023-03-18 DIAGNOSIS — M79672 Pain in left foot: Secondary | ICD-10-CM | POA: Diagnosis not present

## 2023-03-18 DIAGNOSIS — Z741 Need for assistance with personal care: Secondary | ICD-10-CM | POA: Diagnosis not present

## 2023-03-18 DIAGNOSIS — M6281 Muscle weakness (generalized): Secondary | ICD-10-CM | POA: Diagnosis not present

## 2023-03-18 DIAGNOSIS — Z96651 Presence of right artificial knee joint: Secondary | ICD-10-CM | POA: Diagnosis not present

## 2023-03-23 DIAGNOSIS — M76822 Posterior tibial tendinitis, left leg: Secondary | ICD-10-CM | POA: Diagnosis not present

## 2023-03-23 DIAGNOSIS — Z741 Need for assistance with personal care: Secondary | ICD-10-CM | POA: Diagnosis not present

## 2023-03-23 DIAGNOSIS — M79672 Pain in left foot: Secondary | ICD-10-CM | POA: Diagnosis not present

## 2023-03-23 DIAGNOSIS — Z96651 Presence of right artificial knee joint: Secondary | ICD-10-CM | POA: Diagnosis not present

## 2023-03-23 DIAGNOSIS — R278 Other lack of coordination: Secondary | ICD-10-CM | POA: Diagnosis not present

## 2023-03-23 DIAGNOSIS — Z471 Aftercare following joint replacement surgery: Secondary | ICD-10-CM | POA: Diagnosis not present

## 2023-03-23 DIAGNOSIS — M6281 Muscle weakness (generalized): Secondary | ICD-10-CM | POA: Diagnosis not present

## 2023-03-24 DIAGNOSIS — M76822 Posterior tibial tendinitis, left leg: Secondary | ICD-10-CM | POA: Diagnosis not present

## 2023-03-25 DIAGNOSIS — Z96651 Presence of right artificial knee joint: Secondary | ICD-10-CM | POA: Diagnosis not present

## 2023-03-25 DIAGNOSIS — Z741 Need for assistance with personal care: Secondary | ICD-10-CM | POA: Diagnosis not present

## 2023-03-25 DIAGNOSIS — Z471 Aftercare following joint replacement surgery: Secondary | ICD-10-CM | POA: Diagnosis not present

## 2023-03-25 DIAGNOSIS — R278 Other lack of coordination: Secondary | ICD-10-CM | POA: Diagnosis not present

## 2023-03-25 DIAGNOSIS — M79672 Pain in left foot: Secondary | ICD-10-CM | POA: Diagnosis not present

## 2023-03-25 DIAGNOSIS — M6281 Muscle weakness (generalized): Secondary | ICD-10-CM | POA: Diagnosis not present

## 2023-03-25 DIAGNOSIS — M76822 Posterior tibial tendinitis, left leg: Secondary | ICD-10-CM | POA: Diagnosis not present

## 2023-03-30 DIAGNOSIS — H179 Unspecified corneal scar and opacity: Secondary | ICD-10-CM | POA: Diagnosis not present

## 2023-03-30 DIAGNOSIS — H16223 Keratoconjunctivitis sicca, not specified as Sjogren's, bilateral: Secondary | ICD-10-CM | POA: Diagnosis not present

## 2023-03-30 DIAGNOSIS — Z961 Presence of intraocular lens: Secondary | ICD-10-CM | POA: Diagnosis not present

## 2023-04-08 ENCOUNTER — Other Ambulatory Visit: Payer: Self-pay | Admitting: Family Medicine

## 2023-04-08 DIAGNOSIS — Z1231 Encounter for screening mammogram for malignant neoplasm of breast: Secondary | ICD-10-CM

## 2023-06-27 ENCOUNTER — Other Ambulatory Visit: Payer: Self-pay | Admitting: Family Medicine

## 2023-06-27 DIAGNOSIS — M899 Disorder of bone, unspecified: Secondary | ICD-10-CM

## 2023-06-27 DIAGNOSIS — E785 Hyperlipidemia, unspecified: Secondary | ICD-10-CM

## 2023-06-29 ENCOUNTER — Ambulatory Visit
Admission: RE | Admit: 2023-06-29 | Discharge: 2023-06-29 | Disposition: A | Discharge status: Home / Self Care | Payer: Medicare PPO | Source: Ambulatory Visit | Attending: Family Medicine | Admitting: Family Medicine

## 2023-06-29 DIAGNOSIS — Z1231 Encounter for screening mammogram for malignant neoplasm of breast: Secondary | ICD-10-CM | POA: Diagnosis not present

## 2023-07-01 ENCOUNTER — Ambulatory Visit: Payer: Medicare PPO

## 2023-07-01 VITALS — Ht 61.75 in | Wt 182.0 lb

## 2023-07-01 DIAGNOSIS — Z0000 Encounter for general adult medical examination without abnormal findings: Secondary | ICD-10-CM

## 2023-07-01 NOTE — Patient Instructions (Signed)
Ms. Schwager , Thank you for taking time to come for your Medicare Wellness Visit. I appreciate your ongoing commitment to your health goals. Please review the following plan we discussed and let me know if I can assist you in the future.   Referrals/Orders/Follow-Ups/Clinician Recommendations: none  This is a list of the screening recommended for you and due dates:  Health Maintenance  Topic Date Due   DTaP/Tdap/Td vaccine (2 - Tdap) 06/08/2016   Flu Shot  01/07/2023   COVID-19 Vaccine (8 - 2024-25 season) 02/07/2023   Medicare Annual Wellness Visit  06/30/2024   Pneumonia Vaccine  Completed   DEXA scan (bone density measurement)  Completed   Hepatitis C Screening  Completed   Zoster (Shingles) Vaccine  Completed   HPV Vaccine  Aged Out   Colon Cancer Screening  Discontinued    Advanced directives: (Copy Requested) Please bring a copy of your health care power of attorney and living will to the office to be added to your chart at your convenience.  Next Medicare Annual Wellness Visit scheduled for next year: Yes 07/03/2024 @ 8:50am

## 2023-07-01 NOTE — Progress Notes (Signed)
Subjective:   Katie Hale is a 78 y.o. female who presents for Medicare Annual (Subsequent) preventive examination.  Visit Complete: Virtual I connected with  Katie Hale on 07/01/23 by a audio enabled telemedicine application and verified that I am speaking with the correct person using two identifiers.  Patient Location: Home  Provider Location: Home Office  I discussed the limitations of evaluation and management by telemedicine. The patient expressed understanding and agreed to proceed.  Vital Signs: Because this visit was a virtual/telehealth visit, some criteria may be missing or patient reported. Any vitals not documented were not able to be obtained and vitals that have been documented are patient reported.  Patient Medicare AWV questionnaire was completed by the patient on 06/27/2023; I have confirmed that all information answered by patient is correct and no changes since this date.  Cardiac Risk Factors include: advanced age (>25men, >1 women);obesity (BMI >30kg/m2)     Objective:    Today's Vitals   06/27/23 1526 07/01/23 0813  Weight:  182 lb (82.6 kg)  Height:  5' 1.75" (1.568 m)  PainSc: 2  2    Body mass index is 33.56 kg/m.     07/01/2023    8:30 AM 08/12/2022   10:50 AM 06/29/2022    8:24 AM 01/29/2022   10:23 AM 01/22/2022    3:19 PM 11/11/2021    9:02 AM 04/22/2021   10:48 AM  Advanced Directives  Does Patient Have a Medical Advance Directive? Yes Yes No Yes Yes Yes Yes  Type of Estate agent of [place];Living will Living will  Healthcare Power of [place];Living will   Living will  Copy of Healthcare Power of Attorney in Chart? No - copy requested        Would patient like information on creating a medical advance directive?   No - Patient declined        Current Medications (verified) Outpatient Encounter Medications as of 07/01/2023  Medication Sig   Calcium Carb-Cholecalciferol (CALCIUM + D3) 600-200 MG-UNIT TABS Take 1 tablet  by mouth 2 (two) times daily. Take one tablet two times daily.   Cholecalciferol (VITAMIN D3) 2000 UNITS TABS Take 2,000 Units by mouth daily.   Lactobacillus (PROBIOTIC ACIDOPHILUS PO) Take 1 tablet by mouth daily.   meloxicam (MOBIC) 15 MG tablet TAKE 1 TABLET(15 MG) BY MOUTH DAILY AS NEEDED FOR PAIN   Multiple Vitamin (MULTIVITAMIN) tablet Take 1 tablet by mouth daily.   omeprazole (PRILOSEC) 40 MG capsule Take 1 capsule (40 mg total) by mouth every morning.   ondansetron (ZOFRAN) 4 MG tablet TAKE 1 TABLET(4 MG) BY MOUTH EVERY 8 HOURS AS NEEDED FOR NAUSEA OR VOMITING   propranolol (INDERAL) 60 MG tablet TAKE 1 TABLET(60 MG) BY MOUTH TWICE DAILY   RESTASIS 0.05 % ophthalmic emulsion    No facility-administered encounter medications on file as of 07/01/2023.    Allergies (verified) Topamax [topiramate]   History: Past Medical History:  Diagnosis Date   Arthritis    Dry eye    Eczema    Essential tremor    takes Propranolol   Fluttering heart    cut out caffeine and palpitations resolved   GERD (gastroesophageal reflux disease)    HTN (hypertension)    Insomnia    Plantar fasciitis    PONV (postoperative nausea and vomiting)    with hysterectomy only   Vertigo    Past Surgical History:  Procedure Laterality Date   ABDOMINAL HYSTERECTOMY     APPENDECTOMY  BICEPS TENDON REPAIR Right    BREAST BIOPSY Left 1975   neg-noscar seen   BREAST SURGERY     breast biopsy, benign   CATARACT EXTRACTION, BILATERAL     CERVICAL FUSION     CHOLECYSTECTOMY     COLONOSCOPY WITH PROPOFOL N/A 07/25/2018   Procedure: COLONOSCOPY WITH PROPOFOL;  Surgeon: Toney Reil, MD;  Location: [facility] ENDOSCOPY;  Service: Gastroenterology;  Laterality: N/A;   EYE SURGERY  08/2021   HAND TUMOR EXCISION Left 01/2018   KNEE ARTHROSCOPY WITH DRILLING/MICROFRACTURE Left    KNEE ARTHROSCOPY WITH MEDIAL MENISECTOMY Right 01/29/2022   Procedure: KNEE ARTHROSCOPY WITH PARTIAL MEDIAL MENISECTOMY;   Surgeon: Ross Marcus, MD;  Location: [facility] ORS;  Service: Orthopedics;  Laterality: Right;   SHOULDER ARTHROSCOPY Right    Family History  Problem Relation Age of Onset   Tremor Mother    Breast cancer Mother 44   Diabetes Father    Tremor Father    Breast cancer Maternal Aunt 50   Tremor Sister    Tremor Brother    Healthy Child    Colon cancer Neg Hx    Social History   Socioeconomic History   Marital status: Widowed    Spouse name: Not on file   Number of children: 2   Years of education: Not on file   Highest education level: Associate degree: academic program  Occupational History   Occupation: retired    Comment: Chiropodist  Tobacco Use   Smoking status: Never   Smokeless tobacco: Never  Vaping Use   Vaping status: Never Used  Substance and Sexual Activity   Alcohol use: Yes    Comment: occ wine   Drug use: No   Sexual activity: Not on file  Other Topics Concern   Not on file  Social History Narrative   Widowed 2017/08/23- husband died of cancer on hospice    They had been married since 1988   Retired from [employer], Futures trader   From [place], [place]      Right handed   Social Drivers of Health   Financial Resource Strain: Low Risk  (07/01/2023)   Overall Financial Resource Strain (CARDIA)    Difficulty of Paying Living Expenses: Not hard at all  Food Insecurity: No Food Insecurity (07/01/2023)   Hunger Vital Sign    Worried About Running Out of Food in the Last Year: Never true    Ran Out of Food in the Last Year: Never true  Transportation Needs: No Transportation Needs (07/01/2023)   PRAPARE - Administrator, Civil Service (Medical): No    Lack of Transportation (Non-Medical): No  Physical Activity: Insufficiently Active (07/01/2023)   Exercise Vital Sign    Days of Exercise per Week: 4 days    Minutes of Exercise per Session: 30 min  Stress: No Stress Concern Present (07/01/2023)   [employer] of Occupational Health  - Occupational Stress Questionnaire    Feeling of Stress : Only a little  Social Connections: Moderately Isolated (07/01/2023)   Social Connection and Isolation Panel [NHANES]    Frequency of Communication with Friends and Family: More than three times a week    Frequency of Social Gatherings with Friends and Family: Three times a week    Attends Religious Services: More than 4 times per year    Active Member of Clubs or Organizations: No    Attends Banker Meetings: Never    Marital Status: Widowed  Tobacco Counseling Counseling given: Not Answered   Clinical Intake:  Pre-visit preparation completed: No  Pain : 0-10 Pain Score: 2  Pain Type: Chronic pain Pain Location: Generalized Pain Descriptors / Indicators: Aching Pain Onset: More than a month ago Pain Frequency: Intermittent Pain Relieving Factors: sometimes Meloxicam  Pain Relieving Factors: sometimes Meloxicam  BMI - recorded: 33.56 Nutritional Status: BMI > 30  Obese Nutritional Risks: Nausea/ vomitting/ diarrhea (nausea with GERD) Diabetes: No  How often do you need to have someone help you when you read instructions, pamphlets, or other written materials from your doctor or pharmacy?: 1 - Never  Interpreter Needed?: No  Comments: lives alone Information entered by :: B.Zamier Eggebrecht,LPN   Activities of Daily Living    06/27/2023    3:26 PM  In your present state of health, do you have any difficulty performing the following activities:  Hearing? 0  Vision? 0  Difficulty concentrating or making decisions? 0  Walking or climbing stairs? 0  Dressing or bathing? 0  Doing errands, shopping? 0  Preparing Food and eating ? N  Using the Toilet? N  In the past six months, have you accidently leaked urine? Y  Do you have problems with loss of bowel control? N  Managing your Medications? N  Managing your Finances? N  Housekeeping or managing your Housekeeping? N    Patient Care Team: Joaquim Nam, MD as PCP - General (Family Medicine) Tat, Octaviano Batty, DO as Consulting Physician (Neurology)  Indicate any recent Medical Services you may have received from other than [facility] providers in the past year (date may be approximate).     Assessment:   This is a routine wellness examination for Omie.  Hearing/Vision screen Hearing Screening - Comments:: Pt says her hearing is good Vision Screening - Comments:: Pt says she wears readers only but adequate vision Hx cornea surgery and nodules removed Dr Dellie Burns   Goals Addressed               This Visit's Progress     COMPLETED: DIET - EAT MORE FRUITS AND VEGETABLES   On track     Increase physical activity   On track     07/01/23, I will continue to walk 30-45 minutes 4 days per week.       Patient Stated   On track     07/01/23, I will maintain and continue medications as prescribed.       Patient Stated (pt-stated)        I would like to try to get better control of arthritic pain       Depression Screen    07/01/2023    8:23 AM 06/29/2022    8:21 AM 06/27/2021    2:49 PM 06/27/2020    2:09 PM 06/22/2019    2:09 PM 06/17/2018    8:27 AM 06/07/2017   11:35 AM  PHQ 2/9 Scores  PHQ - 2 Score 0 0 0 0 0 2 0  PHQ- 9 Score  0   0 4     Fall Risk    06/27/2023    3:26 PM 08/12/2022   10:49 AM 06/29/2022    8:24 AM 11/11/2021    9:02 AM 04/22/2021   10:48 AM  Fall Risk   Falls in the past year? 0 0 0 0 0  Number falls in past yr: 0 0 0 0 0  Injury with Fall?  0 0 0 0  Risk for fall due  to : No Fall Risks  No Fall Risks    Follow up Education provided;Falls prevention discussed Falls evaluation completed Falls prevention discussed;Falls evaluation completed      MEDICARE RISK AT HOME: Medicare Risk at Home Any stairs in or around the home?: (Patient-Rptd) Yes If so, are there any without handrails?: (Patient-Rptd) No Home free of loose throw rugs in walkways, pet beds, electrical cords, etc?: (Patient-Rptd)  Yes Adequate lighting in your home to reduce risk of falls?: (Patient-Rptd) Yes Life alert?: (Patient-Rptd) No Use of a cane, walker or w/c?: (Patient-Rptd) No Grab bars in the bathroom?: (Patient-Rptd) Yes Shower chair or bench in shower?: (Patient-Rptd) Yes Elevated toilet seat or a handicapped toilet?: (Patient-Rptd) Yes  TIMED UP AND GO:  Was the test performed?  No    Cognitive Function:    06/22/2019    2:11 PM 06/17/2018    8:28 AM  MMSE - Mini Mental State Exam  Orientation to time 5 5  Orientation to Place 5 5  Registration 3 3  Attention/ Calculation 5 0  Recall 3 3  Language- name 2 objects  0  Language- repeat 1 1  Language- follow 3 step command  3  Language- read & follow direction  0  Write a sentence  0  Copy design  0  Total score  20        07/01/2023    8:37 AM 06/29/2022    8:29 AM  6CIT Screen  What Year? 0 points 0 points  What month? 0 points 0 points  What time? 0 points 0 points  Count back from 20 0 points 0 points  Months in reverse 0 points 0 points  Repeat phrase 0 points 0 points  Total Score 0 points 0 points    Immunizations Immunization History  Administered Date(s) Administered   Fluad Quad(high Dose 65+) 02/23/2022   Influenza, High Dose Seasonal PF 03/08/2016, 02/22/2018, 02/02/2019, 02/12/2021, 02/23/2022   Influenza-Unspecified 03/08/2014, 03/09/2015, 04/04/2017, 02/22/2018, 02/21/2020   Moderna SARS-COV2 Booster Vaccination 10/14/2020, 04/17/2022   Moderna Sars-Covid-2 Vaccination 06/20/2019, 07/18/2019, 04/23/2020, 10/14/2020, 02/27/2021   Pneumococcal Conjugate-13 05/22/2015   Pneumococcal Polysaccharide-23 06/09/2015   Respiratory Syncytial Virus Vaccine,Recomb Aduvanted(Arexvy) 07/06/2022   Td 06/08/2006   Zoster Recombinant(Shingrix) 12/22/2017, 02/22/2018   Zoster, Live 06/08/2002    TDAP status: Up to date  Flu Vaccine status: Up to date [facility] in Sept 2024 per pt  Pneumococcal vaccine status: Up to  date  Covid-19 vaccine status: Completed vaccines  Qualifies for Shingles Vaccine? Yes   Zostavax completed Yes   Shingrix Completed?: Yes  Screening Tests Health Maintenance  Topic Date Due   DTaP/Tdap/Td (2 - Tdap) 06/08/2016   INFLUENZA VACCINE  01/07/2023   COVID-19 Vaccine (8 - 2024-25 season) 02/07/2023   Medicare Annual Wellness (AWV)  06/30/2024   Pneumonia Vaccine 82+ Years old  Completed   DEXA SCAN  Completed   Hepatitis C Screening  Completed   Zoster Vaccines- Shingrix  Completed   HPV VACCINES  Aged Out   Colonoscopy  Discontinued    Health Maintenance  Health Maintenance Due  Topic Date Due   DTaP/Tdap/Td (2 - Tdap) 06/08/2016   INFLUENZA VACCINE  01/07/2023   COVID-19 Vaccine (8 - 2024-25 season) 02/07/2023    Colorectal cancer screening: No longer required.   Mammogram status: No longer required due to age.  Bone Density status: Completed 10/23/2021. Results reflect: Bone density results: NORMAL. Repeat every 5 years.  Lung Cancer Screening: (Low Dose CT  Chest recommended if Age 73-80 years, 20 pack-year currently smoking OR have quit w/in 15years.) does not qualify.   Lung Cancer Screening Referral: no  Additional Screening:  Hepatitis C Screening: does not qualify; Completed 05/15/2016  Vision Screening: Recommended annual ophthalmology exams for early detection of glaucoma and other disorders of the eye. Is the patient up to date with their annual eye exam?  Yes  Who is the provider or what is the name of the office in which the patient attends annual eye exams? Dr Dellie Burns If pt is not established with a provider, would they like to be referred to a provider to establish care? No .   Dental Screening: Recommended annual dental exams for proper oral hygiene  Diabetic Foot Exam: n/a  Community Resource Referral / Chronic Care Management: CRR required this visit?  No   CCM required this visit?  No     Plan:     I have personally  reviewed and noted the following in the patient's chart:   Medical and social history Use of alcohol, tobacco or illicit drugs  Current medications and supplements including opioid prescriptions. Patient is not currently taking opioid prescriptions. Functional ability and status Nutritional status Physical activity Advanced directives List of other physicians Hospitalizations, surgeries, and ER visits in previous 12 months Vitals Screenings to include cognitive, depression, and falls Referrals and appointments  In addition, I have reviewed and discussed with patient certain preventive protocols, quality metrics, and best practice recommendations. A written personalized care plan for preventive services as well as general preventive health recommendations were provided to patient.   Sue Lush, LPN   4/33/2951   After Visit Summary: (MyChart) Due to this being a telephonic visit, the after visit summary with patients personalized plan was offered to patient via MyChart   Nurse Notes: The patient states she is doing well. She does want to discuss managing arthritic pain with PCP at visit next week. Other that this she has no concerns or questions at this time.

## 2023-07-02 ENCOUNTER — Other Ambulatory Visit (INDEPENDENT_AMBULATORY_CARE_PROVIDER_SITE_OTHER): Payer: Medicare PPO

## 2023-07-02 DIAGNOSIS — M899 Disorder of bone, unspecified: Secondary | ICD-10-CM

## 2023-07-02 DIAGNOSIS — M949 Disorder of cartilage, unspecified: Secondary | ICD-10-CM

## 2023-07-02 DIAGNOSIS — E785 Hyperlipidemia, unspecified: Secondary | ICD-10-CM | POA: Diagnosis not present

## 2023-07-02 LAB — COMPREHENSIVE METABOLIC PANEL
ALT: 11 U/L (ref 0–35)
AST: 15 U/L (ref 0–37)
Albumin: 4.4 g/dL (ref 3.5–5.2)
Alkaline Phosphatase: 85 U/L (ref 39–117)
BUN: 18 mg/dL (ref 6–23)
CO2: 30 meq/L (ref 19–32)
Calcium: 9.4 mg/dL (ref 8.4–10.5)
Chloride: 104 meq/L (ref 96–112)
Creatinine, Ser: 0.75 mg/dL (ref 0.40–1.20)
GFR: 75.73 mL/min (ref >60.00)
Glucose, Bld: 89 mg/dL (ref 70–99)
Potassium: 4 meq/L (ref 3.5–5.1)
Sodium: 142 meq/L (ref 135–145)
Total Bilirubin: 0.5 mg/dL (ref 0.2–1.2)
Total Protein: 6.8 g/dL (ref 6.0–8.3)

## 2023-07-02 LAB — CBC WITH DIFFERENTIAL/PLATELET
Basophils Absolute: 0.1 10*3/uL (ref 0.0–0.1)
Basophils Relative: 1.4 % (ref 0.0–3.0)
Eosinophils Absolute: 0.1 10*3/uL (ref 0.0–0.7)
Eosinophils Relative: 2.9 % (ref 0.0–5.0)
HCT: 41.7 % (ref 36.0–46.0)
Hemoglobin: 13.9 g/dL (ref 12.0–15.0)
Lymphocytes Relative: 20.7 % (ref 12.0–46.0)
Lymphs Abs: 1 10*3/uL (ref 0.7–4.0)
MCHC: 33.4 g/dL (ref 30.0–36.0)
MCV: 95.9 fL (ref 78.0–100.0)
Monocytes Absolute: 0.6 10*3/uL (ref 0.1–1.0)
Monocytes Relative: 12.4 % — ABNORMAL HIGH (ref 3.0–12.0)
Neutro Abs: 3 10*3/uL (ref 1.4–7.7)
Neutrophils Relative %: 62.6 % (ref 43.0–77.0)
Platelets: 249 10*3/uL (ref 150.0–400.0)
RBC: 4.35 Mil/uL (ref 3.87–5.11)
RDW: 13.8 % (ref 11.5–15.5)
WBC: 4.8 10*3/uL (ref 4.0–10.5)

## 2023-07-02 LAB — LIPID PANEL
Cholesterol: 215 mg/dL — ABNORMAL HIGH (ref 0–200)
HDL: 68.2 mg/dL (ref >39.00)
LDL Cholesterol: 118 mg/dL — ABNORMAL HIGH (ref 0–99)
NonHDL: 146.33
Total CHOL/HDL Ratio: 3
Triglycerides: 140 mg/dL (ref 0.0–149.0)
VLDL: 28 mg/dL (ref 0.0–40.0)

## 2023-07-02 LAB — TSH: TSH: 1.86 u[IU]/mL (ref 0.35–5.50)

## 2023-07-02 LAB — VITAMIN D 25 HYDROXY (VIT D DEFICIENCY, FRACTURES): VITD: 34.53 ng/mL (ref 30.00–100.00)

## 2023-07-09 ENCOUNTER — Ambulatory Visit (INDEPENDENT_AMBULATORY_CARE_PROVIDER_SITE_OTHER): Payer: Medicare PPO | Admitting: Family Medicine

## 2023-07-09 ENCOUNTER — Other Ambulatory Visit: Payer: Self-pay | Admitting: Family Medicine

## 2023-07-09 ENCOUNTER — Encounter: Payer: Self-pay | Admitting: Family Medicine

## 2023-07-09 VITALS — BP 142/80 | HR 53 | Temp 98.5°F | Ht 61.61 in | Wt 180.4 lb

## 2023-07-09 DIAGNOSIS — Z0000 Encounter for general adult medical examination without abnormal findings: Secondary | ICD-10-CM

## 2023-07-09 DIAGNOSIS — M1990 Unspecified osteoarthritis, unspecified site: Secondary | ICD-10-CM | POA: Diagnosis not present

## 2023-07-09 DIAGNOSIS — G250 Essential tremor: Secondary | ICD-10-CM

## 2023-07-09 DIAGNOSIS — K219 Gastro-esophageal reflux disease without esophagitis: Secondary | ICD-10-CM

## 2023-07-09 DIAGNOSIS — Z7189 Other specified counseling: Secondary | ICD-10-CM

## 2023-07-09 MED ORDER — PREDNISONE 10 MG PO TABS: ORAL_TABLET | ORAL | 0 refills | Status: DC

## 2023-07-09 MED ORDER — MELOXICAM 15 MG PO TABS: 7.5000 mg | ORAL_TABLET | Freq: Every day | ORAL | 1 refills | Status: AC | PRN

## 2023-07-09 MED ORDER — PROPRANOLOL HCL 60 MG PO TABS: 60.0000 mg | ORAL_TABLET | Freq: Two times a day (BID) | ORAL | 3 refills | Status: AC

## 2023-07-09 MED ORDER — OMEPRAZOLE 40 MG PO CPDR: 40.0000 mg | DELAYED_RELEASE_CAPSULE | ORAL | 3 refills | Status: DC

## 2023-07-09 MED ORDER — ONDANSETRON HCL 4 MG PO TABS: ORAL_TABLET | ORAL | 2 refills | Status: AC

## 2023-07-09 NOTE — Patient Instructions (Addendum)
See about a tetanus shot at the pharmacy.  Take care.  Glad to see you. You could try prednisone without meloxicam.  When done with prednisone, you could try 1/2 tab of meloxicam as needed.

## 2023-07-09 NOTE — Progress Notes (Unsigned)
Flu prev done. Shingles prev done. PNA 2017. Tetanus 2008, d/w pt. RSV vaccine prev done Covid prev done.  Colonoscopy 2020. Breast cancer screening 2025 DXA 2023. Okay to defer for now, d/w pt.   Advance directive- daughter designated if patient were incapacitated.   Labs d/w pt.   Tremor.  Still on propranolol.  Her L hand tremor has increased.  She is still seeing Dr. Arbutus Leas and will follow up soon.    GERD. Still on PPI, managing with occ flare with NSAID use.  She is careful about her diet.    D/w pt about arthritis pain.  S/p knee replacement. Only taking meloxicam about every 4-5 days.  It helps some when used.  She noted increase in tremor with meloxicam use.  B hip and back pain.  More hand pain, with chronic changes at the R PIP joint.  More pain in the last month, with weather changes.    Meds, vitals, and allergies reviewed.   ROS: Per HPI unless specifically indicated in ROS section   GEN: nad, alert and oriented HEENT: mucous membranes moist NECK: supple w/o LA CV: rrr PULM: ctab, no inc wob ABD: soft, +bs EXT: no edema SKIN: no acute rash

## 2023-07-11 NOTE — Assessment & Plan Note (Signed)
Advance directive- daughter designated if patient were incapacitated.   

## 2023-07-11 NOTE — Assessment & Plan Note (Signed)
Still on PPI, managing with occ flare with NSAID use.  She is careful about her diet.  Continue as is.

## 2023-07-11 NOTE — Assessment & Plan Note (Signed)
Continue propranolol and she'll f/u with neuro.

## 2023-07-11 NOTE — Assessment & Plan Note (Signed)
Steroid cautions d/w pt.  Can try prednisone short term without meloxicam.  When done with prednisone, could try 1/2 tab of meloxicam as needed.  Update me as needed.

## 2023-07-11 NOTE — Assessment & Plan Note (Signed)
Flu prev done. Shingles prev done. PNA 2017. Tetanus 2008, d/w pt. RSV vaccine prev done Covid prev done.  Colonoscopy 2020. Breast cancer screening 2025 DXA 2023. Okay to defer for now, d/w pt.   Advance directive- daughter designated if patient were incapacitated.

## 2023-08-07 ENCOUNTER — Encounter: Payer: Self-pay | Admitting: Family Medicine

## 2023-08-10 NOTE — Progress Notes (Unsigned)
 Assessment/Plan:  1.  Essential Tremor  -She will continue propranolol, 60 mg twice per day.  -She did not tolerate topiramate or higher dosages of primidone (and did not find lower dosages of either effective)  -She tried xanax but made too sleepy  -discussed surgical interventions again, both focused ultrasound and DBS.  She is not interested.  2.  Follow-up 1 year  Subjective:   Katie Hale was seen today in follow up for essential tremor.  My previous records were reviewed prior to todays visit.  She states that she is feeling good.  She last saw her primary care physician in January 31.  Notes are reviewed.  Tremor unfortunately is persistent.   Current prescribed movement disorder medications: Propranolol, 60 mg twice per day   PREVIOUS MEDICATIONS: Topamax (higher dosages caused dizziness and lower dosages ineffective); primidone (patient did not find it effective at 100 mg twice per day and had side effects with higher dosages); xanax (sleepy); trazodone (did not help insomnia at low-dose)  ALLERGIES:   Allergies  Allergen Reactions   Topamax [Topiramate] Other (See Comments)    "out of body experience."  Intolerant.      CURRENT MEDICATIONS:  Outpatient Encounter Medications as of 08/12/2023  Medication Sig   Calcium Carb-Cholecalciferol (CALCIUM + D3) 600-200 MG-UNIT TABS Take 1 tablet by mouth 2 (two) times daily. Take one tablet two times daily.   Cholecalciferol (VITAMIN D3) 2000 UNITS TABS Take 2,000 Units by mouth daily.   Lactobacillus (PROBIOTIC ACIDOPHILUS PO) Take 1 tablet by mouth daily.   Lifitegrast (XIIDRA) 5 % SOLN Apply to eye.   meloxicam (MOBIC) 15 MG tablet Take 0.5-1 tablets (7.5-15 mg total) by mouth daily as needed for pain (with food).   Multiple Vitamin (MULTIVITAMIN) tablet Take 1 tablet by mouth daily.   omeprazole (PRILOSEC) 40 MG capsule Take 1 capsule (40 mg total) by mouth every morning.   ondansetron (ZOFRAN) 4 MG tablet TAKE 1  TABLET(4 MG) BY MOUTH EVERY 8 HOURS AS NEEDED FOR NAUSEA OR VOMITING   predniSONE (DELTASONE) 10 MG tablet Take 2 a day for 5 days, then 1 a day for 5 days, with food. Don't take with aleve/ibuprofen/meloxicam.   propranolol (INDERAL) 60 MG tablet Take 1 tablet (60 mg total) by mouth 2 (two) times daily.   No facility-administered encounter medications on file as of 08/12/2023.     Objective:    PHYSICAL EXAMINATION:    VITALS:   There were no vitals filed for this visit.    GEN:  The patient appears stated age and is in NAD. HEENT:  Normocephalic, atraumatic.  The mucous membranes are moist. The superficial temporal arteries are without ropiness or tenderness. CV:  Huston Foley.  regular Lungs:  CTAB Neck/HEME:  There are no carotid bruits bilaterally.  Neurological examination:  Orientation: The patient is alert and oriented x3. Cranial nerves: There is good facial symmetry. The speech is fluent and clear. Soft palate rises symmetrically and there is no tongue deviation. Hearing is intact to conversational tone. Sensation: Sensation is intact to light touch throughout Motor: Strength is at least antigravity x4.  Movement examination: Tone: There is normal tone in the UE/LE Abnormal movements: rare LUE rest tremor (same as previous).  There is a postural tremor bilaterally, mod, L>R.  She has chin tremor.  This is same as previous. Coordination:  There is no decremation with RAM's, with any form of RAMS, including alternating supination and pronation of the forearm, hand opening  and closing, finger taps, heel taps and toe taps bilaterally. Gait and station:  normal stride length with good arm swing bilaterally  I have reviewed and interpreted the following labs independently   Chemistry      Component Value Date/Time   NA 142 07/02/2023 0800   K 4.0 07/02/2023 0800   CL 104 07/02/2023 0800   CO2 30 07/02/2023 0800   BUN 18 07/02/2023 0800   CREATININE 0.75 07/02/2023 0800       Component Value Date/Time   CALCIUM 9.4 07/02/2023 0800   ALKPHOS 85 07/02/2023 0800   AST 15 07/02/2023 0800   AST 17 04/11/2013 0000   ALT 11 07/02/2023 0800   BILITOT 0.5 07/02/2023 0800      Lab Results  Component Value Date   WBC 4.8 07/02/2023   HGB 13.9 07/02/2023   HCT 41.7 07/02/2023   MCV 95.9 07/02/2023   PLT 249.0 07/02/2023   Lab Results  Component Value Date   TSH 1.86 07/02/2023     Chemistry      Component Value Date/Time   NA 142 07/02/2023 0800   K 4.0 07/02/2023 0800   CL 104 07/02/2023 0800   CO2 30 07/02/2023 0800   BUN 18 07/02/2023 0800   CREATININE 0.75 07/02/2023 0800      Component Value Date/Time   CALCIUM 9.4 07/02/2023 0800   ALKPHOS 85 07/02/2023 0800   AST 15 07/02/2023 0800   AST 17 04/11/2013 0000   ALT 11 07/02/2023 0800   BILITOT 0.5 07/02/2023 0800         Total time spent on today's visit was *** minutes, including both face-to-face time and nonface-to-face time.  Time included that spent on review of records (prior notes available to me/labs/imaging if pertinent), discussing treatment and goals, answering patient's questions and coordinating care.  Cc:  Joaquim Nam, MD

## 2023-08-12 ENCOUNTER — Encounter: Payer: Self-pay | Admitting: Neurology

## 2023-08-12 ENCOUNTER — Ambulatory Visit: Payer: Medicare PPO | Admitting: Neurology

## 2023-08-12 VITALS — BP 148/67 | HR 72 | Ht 61.61 in | Wt 181.0 lb

## 2023-08-12 DIAGNOSIS — G250 Essential tremor: Secondary | ICD-10-CM

## 2023-09-08 DIAGNOSIS — M1712 Unilateral primary osteoarthritis, left knee: Secondary | ICD-10-CM | POA: Diagnosis not present

## 2023-09-08 DIAGNOSIS — Z96651 Presence of right artificial knee joint: Secondary | ICD-10-CM | POA: Diagnosis not present

## 2023-11-29 DIAGNOSIS — M1712 Unilateral primary osteoarthritis, left knee: Secondary | ICD-10-CM | POA: Diagnosis not present

## 2023-11-29 DIAGNOSIS — Z96651 Presence of right artificial knee joint: Secondary | ICD-10-CM | POA: Diagnosis not present

## 2024-02-15 ENCOUNTER — Encounter: Payer: Self-pay | Admitting: Family Medicine

## 2024-02-16 ENCOUNTER — Other Ambulatory Visit: Payer: Self-pay | Admitting: Family Medicine

## 2024-02-16 MED ORDER — COVID-19 MRNA VAC-TRIS(PFIZER) 30 MCG/0.3ML IM SUSY: 0.3000 mL | PREFILLED_SYRINGE | Freq: Once | INTRAMUSCULAR | 0 refills | Status: AC

## 2024-02-16 NOTE — Progress Notes (Unsigned)
 Please pull the printed prescription for me to sign and then place upfront.  I sent a note to the patient that she should be able to pick it up by Friday.  Thanks.

## 2024-02-17 NOTE — Progress Notes (Signed)
 Prescription has been signed and placed in box for patient to pickup.

## 2024-02-18 NOTE — Telephone Encounter (Signed)
Patient picked up at front desk.

## 2024-02-20 ENCOUNTER — Encounter: Payer: Self-pay | Admitting: Family Medicine

## 2024-04-04 DIAGNOSIS — D3131 Benign neoplasm of right choroid: Secondary | ICD-10-CM | POA: Diagnosis not present

## 2024-04-04 DIAGNOSIS — H18593 Other hereditary corneal dystrophies, bilateral: Secondary | ICD-10-CM | POA: Diagnosis not present

## 2024-04-04 DIAGNOSIS — Z961 Presence of intraocular lens: Secondary | ICD-10-CM | POA: Diagnosis not present

## 2024-04-04 DIAGNOSIS — H16223 Keratoconjunctivitis sicca, not specified as Sjogren's, bilateral: Secondary | ICD-10-CM | POA: Diagnosis not present

## 2024-04-22 DIAGNOSIS — M1712 Unilateral primary osteoarthritis, left knee: Secondary | ICD-10-CM | POA: Diagnosis not present

## 2024-05-08 ENCOUNTER — Telehealth: Payer: Self-pay

## 2024-05-08 ENCOUNTER — Other Ambulatory Visit: Payer: Self-pay | Admitting: Family Medicine

## 2024-05-08 DIAGNOSIS — E785 Hyperlipidemia, unspecified: Secondary | ICD-10-CM

## 2024-05-08 DIAGNOSIS — M899 Disorder of bone, unspecified: Secondary | ICD-10-CM

## 2024-05-08 DIAGNOSIS — Z1231 Encounter for screening mammogram for malignant neoplasm of breast: Secondary | ICD-10-CM

## 2024-05-08 NOTE — Telephone Encounter (Signed)
 Copied from CRM #[id number]. Topic: Clinical - Request for Lab/Test Order >> May 08, 2024 12:19 PM Charlet HERO wrote: Reason for CRM: Patient is calling to get labs for her yearly done no order in for the request.

## 2024-05-09 NOTE — Telephone Encounter (Signed)
I put in the orders.  Thanks.

## 2024-05-09 NOTE — Telephone Encounter (Signed)
 Called patient scheduled for lab

## 2024-05-09 NOTE — Addendum Note (Signed)
 Addended by: CLEATUS ARLYSS RAMAN on: 05/09/2024 06:51 AM   Modules accepted: Orders

## 2024-06-23 ENCOUNTER — Other Ambulatory Visit: Payer: Self-pay | Admitting: Family Medicine

## 2024-06-26 ENCOUNTER — Other Ambulatory Visit

## 2024-06-29 ENCOUNTER — Ambulatory Visit

## 2024-06-29 ENCOUNTER — Ambulatory Visit
Admission: RE | Admit: 2024-06-29 | Discharge: 2024-06-29 | Disposition: A | Discharge status: Home / Self Care | Source: Ambulatory Visit | Attending: Family Medicine | Admitting: Family Medicine

## 2024-06-29 VITALS — Ht 61.75 in | Wt 175.0 lb

## 2024-06-29 DIAGNOSIS — Z1231 Encounter for screening mammogram for malignant neoplasm of breast: Secondary | ICD-10-CM | POA: Insufficient documentation

## 2024-06-29 DIAGNOSIS — Z0000 Encounter for general adult medical examination without abnormal findings: Secondary | ICD-10-CM | POA: Diagnosis not present

## 2024-06-29 NOTE — Progress Notes (Signed)
 "  Chief Complaint  Patient presents with   Medicare Wellness     Subjective:   Katie Hale is a 79 y.o. female who presents for a Medicare Annual Wellness Visit.  Visit info / Clinical Intake: Medicare Wellness Visit Type:: Subsequent Annual Wellness Visit Persons participating in visit and providing information:: patient Medicare Wellness Visit Mode:: Telephone If telephone:: video declined Since this visit was completed virtually, some vitals may be partially provided or unavailable. Missing vitals are due to the limitations of the virtual format.: Documented vitals are patient reported If Telephone or Video please confirm:: I connected with patient using audio/video enable telemedicine. I verified patient identity with two identifiers, discussed telehealth limitations, and patient agreed to proceed. Patient Location:: home Provider Location:: office Interpreter Needed?: No Pre-visit prep was completed: yes AWV questionnaire completed by patient prior to visit?: yes Date:: 06/27/24 Living arrangements:: (!) (Patient-Rptd) lives alone Patient's Overall Health Status Rating: (Patient-Rptd) very good Typical amount of pain: (Patient-Rptd) some Does pain affect daily life?: (Patient-Rptd) no Are you currently prescribed opioids?: no  Dietary Habits and Nutritional Risks How many meals a day?: (Patient-Rptd) 3 Eats fruit and vegetables daily?: (Patient-Rptd) yes Most meals are obtained by: (Patient-Rptd) preparing own meals In the last 2 weeks, have you had any of the following?: none Diabetic:: no  Functional Status Activities of Daily Living (to include ambulation/medication): (Patient-Rptd) Independent Ambulation: (Patient-Rptd) Independent Medication Administration: (Patient-Rptd) Independent Home Management (perform basic housework or laundry): (Patient-Rptd) Independent Manage your own finances?: (Patient-Rptd) yes Primary transportation is: (Patient-Rptd)  driving Concerns about vision?: no *vision screening is required for WTM* Concerns about hearing?: no  Fall Screening Falls in the past year?: (Patient-Rptd) 0 Number of falls in past year: 0 Was there an injury with Fall?: 0 Fall Risk Category Calculator: 0 Patient Fall Risk Level: Low Fall Risk  Fall Risk Patient at Risk for Falls Due to: Medication side effect Fall risk Follow up: Falls prevention discussed; Education provided; Falls evaluation completed  Home and Transportation Safety: All rugs have non-skid backing?: (Patient-Rptd) yes All stairs or steps have railings?: (Patient-Rptd) yes Grab bars in the bathtub or shower?: (Patient-Rptd) yes Have non-skid surface in bathtub or shower?: (Patient-Rptd) yes Good home lighting?: (Patient-Rptd) yes Regular seat belt use?: (Patient-Rptd) yes Hospital stays in the last year:: (Patient-Rptd) no  Cognitive Assessment Difficulty concentrating, remembering, or making decisions? : (Patient-Rptd) no Will 6CIT or Mini Cog be Completed: yes What year is it?: 0 points What month is it?: 0 points Give patient an address phrase to remember (5 components): [address] [place] About what time is it?: 0 points Count backwards from 20 to 1: 0 points Say the months of the year in reverse: 0 points Repeat the address phrase from earlier: 0 points 6 CIT Score: 0 points  Advance Directives (For Healthcare) Does Patient Have a Medical Advance Directive?: Yes Does patient want to make changes to medical advance directive?: No - Patient declined Type of Advance Directive: Healthcare Power of [place]; Living will Copy of Healthcare Power of Attorney in Chart?: No - copy requested Copy of Living Will in Chart?: No - copy requested  Reviewed/Updated  Reviewed/Updated: Reviewed All (Medical, Surgical, Family, Medications, Allergies, Care Teams, Patient Goals)    Allergies (verified) Topamax  [topiramate ]   Current Medications  (verified) Outpatient Encounter Medications as of 06/29/2024  Medication Sig   Calcium Carb-Cholecalciferol (CALCIUM + D3) 600-200 MG-UNIT TABS Take 1 tablet by mouth 2 (two) times daily. Take one tablet  two times daily.   Cholecalciferol (VITAMIN D3) 2000 UNITS TABS Take 2,000 Units by mouth daily.   Lactobacillus (PROBIOTIC ACIDOPHILUS PO) Take 1 tablet by mouth daily.   Multiple Vitamin (MULTIVITAMIN) tablet Take 1 tablet by mouth daily.   omeprazole  (PRILOSEC) 40 MG capsule TAKE 1 CAPSULE(40 MG) BY MOUTH EVERY MORNING   ondansetron  (ZOFRAN ) 4 MG tablet TAKE 1 TABLET(4 MG) BY MOUTH EVERY 8 HOURS AS NEEDED FOR NAUSEA OR VOMITING   propranolol  (INDERAL ) 60 MG tablet Take 1 tablet (60 mg total) by mouth 2 (two) times daily.   Lifitegrast (XIIDRA) 5 % SOLN Apply to eye.   meloxicam  (MOBIC ) 15 MG tablet Take 0.5-1 tablets (7.5-15 mg total) by mouth daily as needed for pain (with food).   No facility-administered encounter medications on file as of 06/29/2024.    History: Past Medical History:  Diagnosis Date   Arthritis    Dry eye    Eczema    Essential tremor    takes Propranolol    Fluttering heart    cut out caffeine and palpitations resolved   GERD (gastroesophageal reflux disease)    HTN (hypertension)    Insomnia    Plantar fasciitis    PONV (postoperative nausea and vomiting)    with hysterectomy only   Vertigo    Past Surgical History:  Procedure Laterality Date   ABDOMINAL HYSTERECTOMY     APPENDECTOMY     BICEPS TENDON REPAIR Right    BREAST BIOPSY Left 1975   neg-noscar seen   BREAST SURGERY     breast biopsy, benign   CATARACT EXTRACTION, BILATERAL     CERVICAL FUSION     CHOLECYSTECTOMY     COLONOSCOPY WITH PROPOFOL  N/A 07/25/2018   Procedure: COLONOSCOPY WITH PROPOFOL ;  Surgeon: Unk Corinn Skiff, MD;  Location: [facility] END[facility]COPY;  Service: Gastroenterology;  Laterality: N/A;   EYE SURGERY  08/2021   HAND TUMOR EXCISION Left 01/2018   JOINT REPLACEMENT   11/26/2022   KNEE ARTHROSCOPY WITH DRILLING/MICROFRACTURE Left    KNEE ARTHROSCOPY WITH MEDIAL MENISECTOMY Right 01/29/2022   Procedure: KNEE ARTHROSCOPY WITH PARTIAL MEDIAL MENISECTOMY;  Surgeon: Rollene Cough, MD;  Location: [facility] ORS;  Service: Orthopedics;  Laterality: Right;   SHOULDER ARTHROSCOPY Right    SPINE SURGERY  1994   Cervical spine fusion   Family History  Problem Relation Age of Onset   Tremor Mother    Breast cancer Mother 53   Cancer Mother    Arthritis Mother    Hearing loss Mother    Heart disease Mother    Vision loss Mother    Varicose Veins Mother    Diabetes Father    Tremor Father    Hypertension Father    Arthritis Father    Breast cancer Maternal Aunt 50   Tremor Sister    Tremor Brother    Healthy Child    Colon cancer Neg Hx    Social History   Occupational History   Occupation: retired    Comment: Chiropodist  Tobacco Use   Smoking status: Never   Smokeless tobacco: Never  Vaping Use   Vaping status: Never Used  Substance and Sexual Activity   Alcohol use: Not Currently    Comment: Rarely   Drug use: Never   Sexual activity: Not Currently    Birth control/protection: None   Tobacco Counseling Counseling given: Not Answered  SDOH Screenings   Food Insecurity: No Food Insecurity (06/27/2024)  Housing: Unknown (06/27/2024)  Transportation Needs: No  Transportation Needs (06/27/2024)  Utilities: Not At Risk (06/29/2024)  Alcohol Screen: Low Risk (06/27/2024)  Depression (PHQ2-9): Low Risk (06/29/2024)  Financial Resource Strain: Low Risk (06/27/2024)  Physical Activity: Insufficiently Active (06/27/2024)  Social Connections: Moderately Integrated (06/27/2024)  Stress: No Stress Concern Present (06/27/2024)  Tobacco Use: Low Risk (06/29/2024)  Health Literacy: Adequate Health Literacy (06/29/2024)   See flowsheets for full screening details  Depression Screen PHQ 2 & 9 Depression Scale- Over the past 2 weeks, how often have you been  bothered by any of the following problems? Little interest or pleasure in doing things: 0 Feeling down, depressed, or hopeless (PHQ Adolescent also includes...irritable): 0 PHQ-2 Total Score: 0 Trouble falling or staying asleep, or sleeping too much: 0 Feeling tired or having little energy: 0 Poor appetite or overeating (PHQ Adolescent also includes...weight loss): 0 Feeling bad about yourself - or that you are a failure or have let yourself or your family down: 0 Trouble concentrating on things, such as reading the newspaper or watching television (PHQ Adolescent also includes...like school work): 0 Moving or speaking so slowly that other people could have noticed. Or the opposite - being so fidgety or restless that you have been moving around a lot more than usual: 0 Thoughts that you would be better off dead, or of hurting yourself in some way: 0 PHQ-9 Total Score: 0 If you checked off any problems, how difficult have these problems made it for you to do your work, take care of things at home, or get along with other people?: Not difficult at all  Depression Treatment Depression Interventions/Treatment : EYV7-0 Score <4 Follow-up Not Indicated     Goals Addressed               This Visit's Progress     Stay healthy (pt-stated)               Objective:    Today's Vitals   06/29/24 1458  Weight: 175 lb (79.4 kg)  Height: 5' 1.75 (1.568 m)   Body mass index is 32.27 kg/m.  Hearing/Vision screen Vision Screening - Comments:: Regular eye exams Immunizations and Health Maintenance Health Maintenance  Topic Date Due   COVID-19 Vaccine (11 - 2025-26 season) 08/17/2024   Medicare Annual Wellness (AWV)  06/29/2025   DTaP/Tdap/Td (4 - Td or Tdap) 08/06/2033   Pneumococcal Vaccine: 50+ Years  Completed   Influenza Vaccine  Completed   Bone Density Scan  Completed   Zoster Vaccines- Shingrix  Completed   Meningococcal B Vaccine  Aged Out   Mammogram  Discontinued    Colonoscopy  Discontinued   Hepatitis C Screening  Discontinued        Assessment/Plan:  This is a routine wellness examination for Katie Hale.  Patient Care Team: Cleatus Arlyss RAMAN, MD as PCP - General (Family Medicine) Tat, Asberry RAMAN, DO as Consulting Physician (Neurology) Dingeldein, Elspeth, MD (Ophthalmology) Leora Lynwood SAUNDERS, MD as Referring Physician (Orthopedic Surgery)  I have personally reviewed and noted the following in the patients chart:   Medical and social history Use of alcohol, tobacco or illicit drugs  Current medications and supplements including opioid prescriptions. Functional ability and status Nutritional status Physical activity Advanced directives List of other physicians Hospitalizations, surgeries, and ER visits in previous 12 months Vitals Screenings to include cognitive, depression, and falls Referrals and appointments  No orders of the defined types were placed in this encounter.  In addition, I have reviewed and discussed with patient certain preventive  protocols, quality metrics, and best practice recommendations. A written personalized care plan for preventive services as well as general preventive health recommendations were provided to patient.   Katie FORBES Dawn, LPN   [id number]   Return in 1 year (on 06/29/2025).  After Visit Summary: (MyChart) Due to this being a telephonic visit, the after visit summary with patients personalized plan was offered to patient via MyChart   Nurse Notes: No voiced or noted concerns at this time  "

## 2024-06-29 NOTE — Patient Instructions (Signed)
 Katie Hale,  Thank you for taking the time for your Medicare Wellness Visit. I appreciate your continued commitment to your health goals. Please review the care plan we discussed, and feel free to reach out if I can assist you further.  Please note that Annual Wellness Visits do not include a physical exam. Some assessments may be limited, especially if the visit was conducted virtually. If needed, we may recommend an in-person follow-up with your provider.  Ongoing Care Seeing your primary care provider every 3 to 6 months helps us  monitor your health and provide consistent, personalized care.   Referrals If a referral was made during today's visit and you haven't received any updates within two weeks, please contact the referred provider directly to check on the status.  Recommended Screenings:  Health Maintenance  Topic Date Due   Medicare Annual Wellness Visit  06/30/2024   COVID-19 Vaccine (11 - 2025-26 season) 08/17/2024   DTaP/Tdap/Td vaccine (4 - Td or Tdap) 08/06/2033   Pneumococcal Vaccine for age over 73  Completed   Flu Shot  Completed   Osteoporosis screening with Bone Density Scan  Completed   Zoster (Shingles) Vaccine  Completed   Meningitis B Vaccine  Aged Out   Breast Cancer Screening  Discontinued   Colon Cancer Screening  Discontinued   Hepatitis C Screening  Discontinued       06/27/2024    9:44 AM  Advanced Directives  Does Patient Have a Medical Advance Directive? Yes  Type of Estate Agent of [place];Living will  Does patient want to make changes to medical advance directive? No - Patient declined  Copy of Healthcare Power of Attorney in Chart? No - copy requested    Vision: Annual vision screenings are recommended for early detection of glaucoma, cataracts, and diabetic retinopathy. These exams can also reveal signs of chronic conditions such as diabetes and high blood pressure.  Dental: Annual dental screenings help detect early  signs of oral cancer, gum disease, and other conditions linked to overall health, including heart disease and diabetes.  Please see the attached documents for additional preventive care recommendations.

## 2024-07-03 ENCOUNTER — Ambulatory Visit: Payer: Medicare PPO

## 2024-07-03 ENCOUNTER — Ambulatory Visit

## 2024-07-03 ENCOUNTER — Other Ambulatory Visit

## 2024-07-04 ENCOUNTER — Other Ambulatory Visit

## 2024-07-05 ENCOUNTER — Other Ambulatory Visit (INDEPENDENT_AMBULATORY_CARE_PROVIDER_SITE_OTHER)

## 2024-07-05 ENCOUNTER — Ambulatory Visit: Payer: Self-pay | Admitting: Family Medicine

## 2024-07-05 DIAGNOSIS — M949 Disorder of cartilage, unspecified: Secondary | ICD-10-CM

## 2024-07-05 DIAGNOSIS — E785 Hyperlipidemia, unspecified: Secondary | ICD-10-CM

## 2024-07-05 DIAGNOSIS — M899 Disorder of bone, unspecified: Secondary | ICD-10-CM | POA: Diagnosis not present

## 2024-07-05 LAB — CBC WITH DIFFERENTIAL/PLATELET
Basophils Absolute: 0.1 10*3/uL (ref 0.0–0.1)
Basophils Relative: 0.8 % (ref 0.0–3.0)
Eosinophils Absolute: 0.2 10*3/uL (ref 0.0–0.7)
Eosinophils Relative: 2.8 % (ref 0.0–5.0)
HCT: 41.5 % (ref 36.0–46.0)
Hemoglobin: 14.1 g/dL (ref 12.0–15.0)
Lymphocytes Relative: 13.4 % (ref 12.0–46.0)
Lymphs Abs: 0.8 10*3/uL (ref 0.7–4.0)
MCHC: 34 g/dL (ref 30.0–36.0)
MCV: 93.9 fl (ref 78.0–100.0)
Monocytes Absolute: 0.6 10*3/uL (ref 0.1–1.0)
Monocytes Relative: 10.2 % (ref 3.0–12.0)
Neutro Abs: 4.6 10*3/uL (ref 1.4–7.7)
Neutrophils Relative %: 72.8 % (ref 43.0–77.0)
Platelets: 261 10*3/uL (ref 150.0–400.0)
RBC: 4.42 Mil/uL (ref 3.87–5.11)
RDW: 13.9 % (ref 11.5–15.5)
WBC: 6.4 10*3/uL (ref 4.0–10.5)

## 2024-07-05 LAB — LIPID PANEL
Cholesterol: 197 mg/dL (ref 28–200)
HDL: 66.5 mg/dL
LDL Cholesterol: 110 mg/dL — ABNORMAL HIGH (ref 10–99)
NonHDL: 130.92
Total CHOL/HDL Ratio: 3
Triglycerides: 106 mg/dL (ref 10.0–149.0)
VLDL: 21.2 mg/dL (ref 0.0–40.0)

## 2024-07-05 LAB — COMPREHENSIVE METABOLIC PANEL WITH GFR
ALT: 12 U/L (ref 3–35)
AST: 15 U/L (ref 5–37)
Albumin: 4.4 g/dL (ref 3.5–5.2)
Alkaline Phosphatase: 78 U/L (ref 39–117)
BUN: 22 mg/dL (ref 6–23)
CO2: 31 meq/L (ref 19–32)
Calcium: 9.2 mg/dL (ref 8.4–10.5)
Chloride: 103 meq/L (ref 96–112)
Creatinine, Ser: 0.72 mg/dL (ref 0.40–1.20)
GFR: 78.97 mL/min
Glucose, Bld: 92 mg/dL (ref 70–99)
Potassium: 3.9 meq/L (ref 3.5–5.1)
Sodium: 141 meq/L (ref 135–145)
Total Bilirubin: 0.6 mg/dL (ref 0.2–1.2)
Total Protein: 7 g/dL (ref 6.0–8.3)

## 2024-07-05 LAB — TSH: TSH: 1.72 u[IU]/mL (ref 0.35–5.50)

## 2024-07-05 LAB — VITAMIN D 25 HYDROXY (VIT D DEFICIENCY, FRACTURES): VITD: 42.98 ng/mL (ref 30.00–100.00)

## 2024-07-10 ENCOUNTER — Encounter: Admitting: Family Medicine

## 2024-07-20 ENCOUNTER — Encounter: Admitting: Family Medicine

## 2024-08-18 ENCOUNTER — Ambulatory Visit: Admitting: Neurology

## 2025-07-03 ENCOUNTER — Ambulatory Visit
